# Patient Record
Sex: Male | Born: 1980 | Race: White | Hispanic: No | Marital: Married | Smoking: Former smoker
Health system: Southern US, Community
[De-identification: ages and names within clinical notes are randomized; demographics above are authoritative.]

## PROBLEM LIST (undated history)

## (undated) DIAGNOSIS — T8859XA Other complications of anesthesia, initial encounter: Secondary | ICD-10-CM

## (undated) DIAGNOSIS — J45909 Unspecified asthma, uncomplicated: Secondary | ICD-10-CM

## (undated) DIAGNOSIS — T4145XA Adverse effect of unspecified anesthetic, initial encounter: Secondary | ICD-10-CM

## (undated) DIAGNOSIS — R0789 Other chest pain: Secondary | ICD-10-CM

## (undated) DIAGNOSIS — N189 Chronic kidney disease, unspecified: Secondary | ICD-10-CM

## (undated) DIAGNOSIS — M5416 Radiculopathy, lumbar region: Secondary | ICD-10-CM

## (undated) HISTORY — DX: Radiculopathy, lumbar region: M54.16

## (undated) HISTORY — PX: WISDOM TOOTH EXTRACTION: SHX21

## (undated) HISTORY — PX: WRIST SURGERY: SHX841

---

## 2009-02-13 ENCOUNTER — Ambulatory Visit: Payer: Self-pay | Admitting: Occupational Medicine

## 2009-02-13 LAB — CONVERTED CEMR LAB
Blood in Urine, dipstick: NEGATIVE
Glucose, Urine, Semiquant: NEGATIVE
Nitrite: NEGATIVE
Protein, U semiquant: 30
Specific Gravity, Urine: >=1.03
Urobilinogen, UA: 0.2

## 2009-04-20 ENCOUNTER — Ambulatory Visit: Payer: Self-pay | Admitting: Family Medicine

## 2009-04-20 DIAGNOSIS — K5900 Constipation, unspecified: Secondary | ICD-10-CM | POA: Insufficient documentation

## 2009-04-20 DIAGNOSIS — R1032 Left lower quadrant pain: Secondary | ICD-10-CM | POA: Insufficient documentation

## 2009-04-20 LAB — CONVERTED CEMR LAB
Protein, U semiquant: 30
Specific Gravity, Urine: >=1.03
Urobilinogen, UA: 0.2
pH: 6

## 2010-09-23 DIAGNOSIS — R0789 Other chest pain: Secondary | ICD-10-CM

## 2010-09-23 HISTORY — DX: Other chest pain: R07.89

## 2011-10-07 ENCOUNTER — Emergency Department (INDEPENDENT_AMBULATORY_CARE_PROVIDER_SITE_OTHER)
Admission: EM | Admit: 2011-10-07 | Discharge: 2011-10-07 | Disposition: A | Discharge status: Other Acute Inpatient Hospital | Payer: BC Managed Care – PPO | Source: Home / Self Care

## 2011-10-07 ENCOUNTER — Encounter: Payer: Self-pay | Admitting: *Deleted

## 2011-10-07 DIAGNOSIS — R072 Precordial pain: Secondary | ICD-10-CM

## 2011-10-07 NOTE — ED Provider Notes (Signed)
History     CSN: [id number]  Arrival date & time 10/07/11  1124   None     Chief Complaint  Patient presents with  . Chest Pain      HPI Comments: Patient states that he was cleaning out his boat at about 5:30PM yesterday when he suddenly became very fatigued.  He quit and went into house to take a shower.  He then developed dull pressure-like left chest discomfort that did not radiate.  He denies nausea/vomiting, sweating, or shortness of breath.  The pain was not associated with food intake, although he has had reflux symptoms in the past.  He had difficulty falling asleep, but slept without difficulty.  Upon awakening this morning his pain persisted.  He recalls no injury to his chest, or recent change in physical activities.  He states that he has had costochondritis in the past, but his present pain is different, and he still feels fatigued.  He has no respiratory symptoms.  The pain is not worse with chest movement or inspiration. He has multiple relatives on both sides of family with ASHD, including a paternal uncle with MI age 12. He has never had a lipid panel or cardiac stress test.  Patient is a 31 y.o. male presenting with chest pain. The history is provided by the patient and the spouse.  Chest Pain The chest pain began 12 - 24 hours ago. Chest pain occurs constantly. The chest pain is unchanged. Associated with: nothing. The severity of the pain is mild. The quality of the pain is described as pressure-like. The pain does not radiate. Primary symptoms include fatigue. Pertinent negatives for primary symptoms include no fever, no syncope, no shortness of breath, no cough, no wheezing, no palpitations, no abdominal pain, no nausea, no vomiting and no dizziness.  Associated symptoms include weakness.  Pertinent negatives for associated symptoms include no lower extremity edema and no numbness. He tried nothing for the symptoms. Risk factors include male gender and obesity.  His  family medical history is significant for CAD in family, heart disease in family, hyperlipidemia in family and early MI in family.     History reviewed. No pertinent past medical history.  Past Surgical History  Procedure Date  . Wrist surgery     Family History  Problem Relation Age of Onset  . Heart attack Mother   . Heart attack Father     History  Substance Use Topics  . Smoking status: Former Smoker -- 10 years    Types: Cigarettes    Quit date: 10/06/2010  . Smokeless tobacco: Yes  . Alcohol Use: Yes     occassionally      Review of Systems  Constitutional: Positive for fatigue. Negative for fever.  Respiratory: Negative for cough, shortness of breath and wheezing.   Cardiovascular: Positive for chest pain. Negative for palpitations and syncope.  Gastrointestinal: Negative for nausea, vomiting and abdominal pain.  Neurological: Positive for weakness. Negative for dizziness and numbness.    Allergies  Penicillins  Home Medications  No current outpatient prescriptions on file.  BP 115/80  Pulse 55  Temp(Src) 98.3 F (36.8 C) (Oral)  Resp 14  Ht 6' (1.829 m)  Wt 260 lb (117.935 kg)  BMI 35.26 kg/m2  SpO2 99%  Physical Exam Nursing notes and Vital Signs reviewed. Appearance:  Patient appears stated age, and in no acute distress.  Patient is obese (BMI 35.3)   Eyes:  Pupils are equal, round, and reactive to light  and accomodation.  Extraocular movement is intact.  Conjunctivae are not inflamed  Nose:  No discharge  Pharynx:  Normal Neck:  Supple.   No adenopathy  Lungs:  Clear to auscultation.  Breath sounds are equal.  Chest:  No tenderness over sternum or left pectoralis muscle Heart:  Regular rate and rhythm without murmurs, rubs, or gallops.  Abdomen:  Nontender without masses or hepatosplenomegaly.  Bowel sounds are present.  No CVA or flank tenderness.  Extremities:  No edema.  No calf tenderness Skin:  Warm and dry.  No rash present.   ED  Course  Procedures    EKG:  Sinus bradycardia, rate 58.  No acute changes.    1. Chest pain, precordial       MDM  Suspect angina; pain relieved by NTG SL. Administered O2 by nasal cannula, aspirin PO. Patient transported to [facility] ER for further evaluation        Donna Christen, MD 10/07/11 1358

## 2011-10-07 NOTE — ED Notes (Signed)
One 0.4mg  NTG given @ 1130 AM.  Patient denies pain and is resting comfortably. V/S: 116/78, HR 67 @ 1138AM

## 2011-10-07 NOTE — ED Notes (Signed)
Patient c/o CP x last night. Denies SOB but hesitant to take a deep breath. Pain is on the left side and described as "pressure" 4/10. C/o mild left arm numbness. No diaphoresis, nausea or SOB. Significant family history of MI. EKG done and physician notified.

## 2012-06-02 ENCOUNTER — Encounter: Payer: Self-pay | Admitting: *Deleted

## 2012-06-02 ENCOUNTER — Emergency Department
Admission: EM | Admit: 2012-06-02 | Discharge: 2012-06-02 | Disposition: A | Discharge status: Home / Self Care | Payer: BC Managed Care – PPO | Source: Home / Self Care

## 2012-06-02 DIAGNOSIS — R590 Localized enlarged lymph nodes: Secondary | ICD-10-CM

## 2012-06-02 DIAGNOSIS — R599 Enlarged lymph nodes, unspecified: Secondary | ICD-10-CM

## 2012-06-02 DIAGNOSIS — W57XXXA Bitten or stung by nonvenomous insect and other nonvenomous arthropods, initial encounter: Secondary | ICD-10-CM

## 2012-06-02 MED ORDER — DOXYCYCLINE HYCLATE 100 MG PO CAPS: 100.0000 mg | ORAL_CAPSULE | Freq: Two times a day (BID) | ORAL | Status: AC

## 2012-06-02 NOTE — ED Provider Notes (Signed)
History     CSN: [id number]  Arrival date & time 06/02/12  1205   First MD Initiated Contact with Patient 06/02/12 1214      Chief Complaint  Patient presents with  . Cyst    back of left neck   HPI Pt reports being bit by horsefly on head approx 1 week. Pt states that he noticed some posterior head and neck swelling approx 2-3 days after horsefly bite. Swelling has persisted over the course of the week. No fevers, chills, HA, nuchal rigidity. Some area of posterior hear have been painful. Minimal redness. No purulence or drainage.  History reviewed. No pertinent past medical history.  Past Surgical History  Procedure Date  . Wrist surgery     Family History  Problem Relation Age of Onset  . Heart attack Mother   . Heart disease Mother   . Heart attack Father   . Heart disease Father     History  Substance Use Topics  . Smoking status: Former Smoker -- 10 years    Types: Cigarettes    Quit date: 10/06/2010  . Smokeless tobacco: Not on file  . Alcohol Use: Yes     occassionally      Review of Systems  All other systems reviewed and are negative.    Allergies  Penicillins  Home Medications  No current outpatient prescriptions on file.  BP 122/87  Pulse 60  Temp 98.2 F (36.8 C) (Oral)  Resp 16  Ht 5' 11.5" (1.816 m)  Wt 259 lb (117.482 kg)  BMI 35.62 kg/m2  SpO2 98%  Physical Exam  Constitutional: He is oriented to person, place, and time. He appears well-developed and well-nourished.  HENT:  Head: Normocephalic and atraumatic.  Right Ear: External ear normal.  Left Ear: External ear normal.  Mouth/Throat: Oropharynx is clear and moist.       + post auricular, occipital, and post neck lymphadenopathy.    Eyes: Conjunctivae normal are normal. Pupils are equal, round, and reactive to light.  Neck: Normal range of motion. Neck supple.       + post auricular lymphadenopathy bilaterally    Cardiovascular: Normal rate, regular rhythm and normal  heart sounds.   Pulmonary/Chest: Effort normal and breath sounds normal.  Abdominal: Soft.  Musculoskeletal: Normal range of motion.  Neurological: He is alert and oriented to person, place, and time.  Skin: Skin is warm.    ED Course  Procedures (including critical care time)  Labs Reviewed - No data to display No results found.   1. Lymphadenopathy, occipital   2. Insect bite       MDM  Suspect this is secondary lymphadenopathy from insect bite.  Will place on doxycycline for soft tissue coverage.  Discussed neuro and infectious red flags.  Follow up as needed.         Doree Albee, MD 06/09/12 [phone]

## 2012-06-02 NOTE — ED Notes (Signed)
Patient was bitten by a horse fly 1 week ago, 4 days ago he developed a knot on the back of his head/neck that is tender to touch.

## 2012-06-08 ENCOUNTER — Ambulatory Visit (INDEPENDENT_AMBULATORY_CARE_PROVIDER_SITE_OTHER): Payer: BC Managed Care – PPO | Admitting: Sports Medicine

## 2012-06-08 ENCOUNTER — Encounter: Payer: Self-pay | Admitting: Sports Medicine

## 2012-06-08 VITALS — BP 131/71 | HR 65 | Temp 97.6°F | Resp 18 | Ht 70.25 in | Wt 257.0 lb

## 2012-06-08 DIAGNOSIS — K2970 Gastritis, unspecified, without bleeding: Secondary | ICD-10-CM

## 2012-06-08 DIAGNOSIS — R1032 Left lower quadrant pain: Secondary | ICD-10-CM | POA: Insufficient documentation

## 2012-06-08 DIAGNOSIS — K2990 Gastroduodenitis, unspecified, without bleeding: Secondary | ICD-10-CM

## 2012-06-08 DIAGNOSIS — R221 Localized swelling, mass and lump, neck: Secondary | ICD-10-CM | POA: Insufficient documentation

## 2012-06-08 DIAGNOSIS — R220 Localized swelling, mass and lump, head: Secondary | ICD-10-CM

## 2012-06-08 MED ORDER — PREDNISONE 50 MG PO TABS: ORAL_TABLET | ORAL | Status: DC

## 2012-06-08 MED ORDER — RANITIDINE HCL 300 MG PO TABS: 300.0000 mg | ORAL_TABLET | Freq: Two times a day (BID) | ORAL | Status: DC

## 2012-06-08 MED ORDER — OMEPRAZOLE 40 MG PO CPDR: 40.0000 mg | DELAYED_RELEASE_CAPSULE | Freq: Every day | ORAL | Status: DC

## 2012-06-08 NOTE — Patient Instructions (Signed)
GI cocktail in office. Start the prilosec and Zantac 300mg  2x a day. Do this for a couple days before starting the prednisone. Come back to see me in 2 weeks or so to recheck the mass.

## 2012-06-08 NOTE — Assessment & Plan Note (Signed)
This feels like a simple cyst, well-defined, movable, nontender. We will likely ignore it. I would like him to do some prednisone, however the doxycycline he is on now is giving him some gastritis.  We will control gastritis for a few days with a proton pump inhibitor and ranitidine.

## 2012-06-08 NOTE — Assessment & Plan Note (Addendum)
We'll have him stop the doxycycline. GI cocktail and office, proton pump inhibitor, and ranitidine 3 mg twice a day. He does need some prednisone for the mass in his neck, however I will have him deferred this for a few days until the gastritis resolves. It would certainly be prudent to check some blood work including H. pylori this does not resolve.

## 2012-06-08 NOTE — Progress Notes (Signed)
Subjective:    CC: Establish care. Lump on neck  HPI: Michael Rubio is a very pleasant 31 year old male who comes in with a several week history of a knot that he feels in the left side of his neck. He denies any trauma, denies any upper respiratory type symptoms. The knot is not tender to palpation. He saw another provider placed on doxycycline, this is causing severe gastritis type symptoms. Overall over the past week there has really not been a change in the size of the lesion. Only with deep palpation does become tender. The tenderness does not radiate. He has no family history of neck or head cancer.  Gastritis: Present since taking the doxycycline, no vomiting, but multiple eructations, as well as severe epigastric and substernal burning type pain whenever he tries to eat or drink. He denies any melena, hematochezia, or hematemesis.  Past medical history, Surgical history, Family history, Social history, Allergies, and medications have been entered into the medical record, reviewed, and no changes needed.   Review of Systems: No headache, visual changes, nausea, vomiting, diarrhea, constipation, dizziness, abdominal pain, skin rash, fevers, chills, night sweats, weight loss, chest pain, body aches, joint swelling, muscle aches, or shortness of breath.   Objective:    General: Well Developed, well nourished, and in no acute distress.  Neuro: Alert and oriented x3, extra-ocular muscles intact.  HEENT: Normocephalic, atraumatic, pupils equal round reactive to light, neck supple, no masses, no lymphadenopathy, thyroid nonpalpable. There is a small, 1 cm, mobile, well defined nodule in the deep subcutaneous tissues. This is suggestive of a cyst. Skin: Warm and dry, no rashes noted.  Cardiac: Regular rate and rhythm, no murmurs rubs or gallops.  Respiratory: Clear to auscultation bilaterally. Not using accessory muscles, speaking in full sentences.  Abdominal: Soft, only mildly tender to palpation in  the epigastrium, nondistended, positive bowel sounds, no masses, no organomegaly.  Musculoskeletal: Shoulder, elbow, wrist, hip, knee, ankle stable, and with full range of motion.  Impression and Recommendations:

## 2012-06-24 ENCOUNTER — Encounter: Payer: Self-pay | Admitting: Sports Medicine

## 2012-06-24 ENCOUNTER — Ambulatory Visit (INDEPENDENT_AMBULATORY_CARE_PROVIDER_SITE_OTHER): Payer: BC Managed Care – PPO | Admitting: Sports Medicine

## 2012-06-24 VITALS — BP 130/83 | HR 68 | Wt 260.0 lb

## 2012-06-24 DIAGNOSIS — R221 Localized swelling, mass and lump, neck: Secondary | ICD-10-CM

## 2012-06-24 DIAGNOSIS — K2970 Gastritis, unspecified, without bleeding: Secondary | ICD-10-CM

## 2012-06-24 DIAGNOSIS — E669 Obesity, unspecified: Secondary | ICD-10-CM | POA: Insufficient documentation

## 2012-06-24 DIAGNOSIS — M5416 Radiculopathy, lumbar region: Secondary | ICD-10-CM

## 2012-06-24 DIAGNOSIS — IMO0002 Reserved for concepts with insufficient information to code with codable children: Secondary | ICD-10-CM

## 2012-06-24 HISTORY — DX: Radiculopathy, lumbar region: M54.16

## 2012-06-24 NOTE — Progress Notes (Signed)
Subjective:    CC: Followup gastritis, lump in neck.  HPI: Neck mass: Patient presented with a lump in neck at the last visit , I placed him on some prednisone, and it has since resolved. This was likely an occipital lymph node.  Epigastric pain: Likely was related to doxycycline, he was given a GI cocktail, placed on an H2 blocker, and proton pump inhibitors. Symptoms have since resolved.  Low back pain: This is a new problem for [place], he's had pain on and off for 4-5 years. Localizes the pain in the right lower paraspinal muscles, and notes that it radiates down his buttock on the right side, lateral lower leg, lateral foot on the right side. Worse with bending over, not worse with Valsalva. Has been to a chiropractor for multiple visits, and is overall better. Still has some numbness in the above distribution. Wondering if there's anything else he should be doing.  Past medical history, Surgical history, Family history, Social history, Allergies, and medications have been entered into the medical record, reviewed, and no changes needed.   Review of Systems: No fevers, chills, night sweats, weight loss, chest pain, or shortness of breath.   Objective:    General: Well Developed, well nourished, and in no acute distress.  Neuro: Alert and oriented x3, extra-ocular muscles intact.  HEENT: Normocephalic, atraumatic, pupils equal round reactive to light, neck supple, no masses, no lymphadenopathy, thyroid nonpalpable.  Skin: Warm and dry, no rashes. Cardiac: Regular rate and rhythm, no murmurs rubs or gallops.  Respiratory: Clear to auscultation bilaterally. Not using accessory muscles, speaking in full sentences. Back Exam:  Inspection: Unremarkable  Motion: Flexion 45 deg, Extension 45 deg, Side Bending to 45 deg bilaterally,  Rotation to 45 deg bilaterally  SLR laying: Negative  XSLR laying: Negative  Palpable tenderness: None. FABER: negative. Sensory change: Gross sensation intact to  all lumbar and sacral dermatomes.  Reflexes: 2+ at both patellar tendons, 2+ at achilles tendons, Babinski's downgoing.  Strength at foot  Plantar-flexion: 5/5 Dorsi-flexion: 5/5 Eversion: 5/5 Inversion: 5/5  Leg strength  Quad: 5/5 Hamstring: 5/5 Hip flexor: 5/5 Hip abductors: 5/5  Gait unremarkable.   Impression and Recommendations:

## 2012-06-24 NOTE — Assessment & Plan Note (Signed)
Overall improving with chiropractic treatments. I've given him some further rehabilitation exercises to do on a daily basis at home. Should this recur, he would be a candidate for repeat x-rays, MRI, and likely interventional interlaminar injections. He will bring his x-rays from the chiropractor for me to review.

## 2012-06-24 NOTE — Assessment & Plan Note (Signed)
Resolved

## 2012-06-24 NOTE — Assessment & Plan Note (Addendum)
CBC, CMET, lipid panel, TSH. He will come back to discuss weight loss strategies, and health maintenance.

## 2012-08-19 ENCOUNTER — Encounter: Payer: BC Managed Care – PPO | Admitting: Sports Medicine

## 2012-08-24 ENCOUNTER — Telehealth: Payer: Self-pay

## 2012-08-24 ENCOUNTER — Encounter: Payer: Self-pay | Admitting: Sports Medicine

## 2012-08-24 ENCOUNTER — Ambulatory Visit (INDEPENDENT_AMBULATORY_CARE_PROVIDER_SITE_OTHER): Payer: BC Managed Care – PPO

## 2012-08-24 ENCOUNTER — Ambulatory Visit (INDEPENDENT_AMBULATORY_CARE_PROVIDER_SITE_OTHER): Payer: BC Managed Care – PPO | Admitting: Sports Medicine

## 2012-08-24 VITALS — BP 118/73 | HR 72 | Temp 98.1°F | Resp 18 | Wt 259.0 lb

## 2012-08-24 DIAGNOSIS — J0100 Acute maxillary sinusitis, unspecified: Secondary | ICD-10-CM

## 2012-08-24 DIAGNOSIS — R071 Chest pain on breathing: Secondary | ICD-10-CM

## 2012-08-24 DIAGNOSIS — R091 Pleurisy: Secondary | ICD-10-CM

## 2012-08-24 DIAGNOSIS — R0989 Other specified symptoms and signs involving the circulatory and respiratory systems: Secondary | ICD-10-CM

## 2012-08-24 MED ORDER — FLUTICASONE PROPIONATE 50 MCG/ACT NA SUSP: NASAL | Status: DC

## 2012-08-24 MED ORDER — AZITHROMYCIN 250 MG PO TABS: ORAL_TABLET | ORAL | Status: DC

## 2012-08-24 NOTE — Assessment & Plan Note (Signed)
Few crackles also heard at the left base. Chest x-ray. Azithromycin for sinusitis should cover community-acquired pneumonia if present.

## 2012-08-24 NOTE — Assessment & Plan Note (Signed)
With double sickening. Azithromycin and Flonase. Return to clinic as needed.

## 2012-08-24 NOTE — Telephone Encounter (Signed)
Michael Rubio called and would like the results to his chest xray.

## 2012-08-24 NOTE — Progress Notes (Signed)
Subjective:     Patient ID: Michael Rubio, male   DOB: 1981/03/16, 31 y.o.   MRN: [id number]  HPI Patient is a 31 yo man who presents with a 5 day history of cough and runny nose and after developing a left sided knife like pain on inspiration this morning.   Patient states that he woke up and it hurt to take deep breaths in. He could not recreate the pain on inspiration and it does not bother him when he coughs or sneezes.   The patients history of URI symptoms date back 5 days, he was improving until yesterday at which time it started to progress again in particular his cough was worse.   He has not taken any medications and has not found anything that makes his symptoms better. The pain is worsened on inspiration.   Past medical history, Surgical history, Family history, Social history, Allergies, and medications have been entered into the medical record, reviewed, and no changes needed.   Review of Systems  Constitutional: Negative for fever, chills, diaphoresis and fatigue.  HENT: Positive for congestion, rhinorrhea, postnasal drip, sinus pressure and ear discharge. Negative for ear pain.   Respiratory: Positive for cough.   Cardiovascular: Negative for chest pain.  Gastrointestinal: Negative.   All other systems reviewed and are negative.      Objective:   Physical Exam  Constitutional: He is oriented to person, place, and time. He appears well-developed.  HENT:  Head: Normocephalic and atraumatic.  Mouth/Throat: No oropharyngeal exudate.       Erythema on Right tympanic membrane and oropharynx. Boggy erythematous nasal turbinates  Eyes: Conjunctivae normal are normal. Pupils are equal, round, and reactive to light.  Neck: Normal range of motion. Neck supple.  Cardiovascular: Normal rate, regular rhythm and normal heart sounds.  Exam reveals no gallop and no friction rub.   No murmur heard. Pulmonary/Chest: Effort normal. No respiratory distress. He has no wheezes. He has  no rales. He exhibits no tenderness.       Crackles at base of left lung  Abdominal: Soft. Bowel sounds are normal. He exhibits no distension and no mass. There is no tenderness. There is no rebound and no guarding.  Musculoskeletal: Normal range of motion.  Neurological: He is alert and oriented to person, place, and time.  Skin: Skin is dry.  Psychiatric: He has a normal mood and affect. His behavior is normal. Thought content normal.      Assessment/plan:

## 2012-08-24 NOTE — Telephone Encounter (Signed)
Negative, this is good news.

## 2012-08-24 NOTE — Telephone Encounter (Signed)
Patient advised.

## 2012-08-27 ENCOUNTER — Encounter: Payer: BC Managed Care – PPO | Admitting: Sports Medicine

## 2012-09-25 ENCOUNTER — Encounter: Payer: BC Managed Care – PPO | Admitting: Sports Medicine

## 2012-10-07 ENCOUNTER — Encounter: Payer: Self-pay | Admitting: Family Medicine

## 2012-10-07 ENCOUNTER — Ambulatory Visit (INDEPENDENT_AMBULATORY_CARE_PROVIDER_SITE_OTHER): Payer: BC Managed Care – PPO | Admitting: Family Medicine

## 2012-10-07 VITALS — BP 121/77 | HR 98 | Temp 98.2°F | Wt 254.0 lb

## 2012-10-07 DIAGNOSIS — J111 Influenza due to unidentified influenza virus with other respiratory manifestations: Secondary | ICD-10-CM

## 2012-10-07 DIAGNOSIS — R509 Fever, unspecified: Secondary | ICD-10-CM

## 2012-10-07 LAB — POCT INFLUENZA A/B: Influenza A, POC: POSITIVE

## 2012-10-07 MED ORDER — OSELTAMIVIR PHOSPHATE 75 MG PO CAPS: 75.0000 mg | ORAL_CAPSULE | Freq: Two times a day (BID) | ORAL | Status: DC

## 2012-10-07 MED ORDER — HYDROCODONE-HOMATROPINE 5-1.5 MG/5ML PO SYRP: 5.0000 mL | ORAL_SOLUTION | Freq: Four times a day (QID) | ORAL | Status: DC | PRN

## 2012-10-07 NOTE — Progress Notes (Signed)
CC: Michael Rubio is a 32 y.o. male is here for Fever   Subjective: HPI:  Patient reports 24 hours of body aches, nonproductive cough, very mild shortness of breath with exertion, fever of 101.0 last night, and fatigue. Symptoms are described as mild to moderate and fluctuates throughout the day not particularly influenced anything in particular. Body aches are diffuse. Tylenol product has helped with aches and fever, nothing else has seemed to make constellation of symptoms better or worse. Denies chills, confusion, wheezing, chest pain, back pain, abdominal pain, GI disturbance, rashes, focal joint pain. No sick contacts     Review Of Systems Outlined In HPI  History reviewed. No pertinent past medical history.   Family History  Problem Relation Age of Onset  . Heart attack Mother   . Heart disease Mother   . Heart attack Father   . Heart disease Father   . Heart disease Maternal Grandmother   . Diabetes Maternal Grandmother   . Heart disease Maternal Grandfather   . COPD Paternal Grandfather   . Heart disease Paternal Grandfather      History  Substance Use Topics  . Smoking status: Former Smoker -- 1.0 packs/day for 10 years    Types: Cigarettes    Quit date: 10/06/2010  . Smokeless tobacco: Current User    Types: Snuff, Chew  . Alcohol Use: 1.2 oz/week    2 Cans of beer per week     Comment: occassionally     Objective: Filed Vitals:   10/07/12 1038  BP: 121/77  Pulse: 98  Temp: 98.2 F (36.8 C)    General: Alert and Oriented, No Acute Distress, appears mildly fatigue HEENT: Pupils equal, round, reactive to light. Conjunctivae clear.  External ears unremarkable, canals clear with intact TMs with appropriate landmarks.  Middle ear appears open without effusion. Pink inferior turbinates.  Moist mucous membranes, pharynx without inflammation nor lesions.  Neck supple without palpable lymphadenopathy nor abnormal masses. Lungs: Clear to auscultation bilaterally,  no wheezing/ronchi/rales.  Comfortable work of breathing. Good air movement., Coughing infertility during exam Cardiac: Regular rate and rhythm. Normal S1/S2.  No murmurs, rubs, nor gallops.   Extremities: No peripheral edema.  Strong peripheral pulses.  Mental Status: No depression, anxiety, nor agitation. Skin: Warm and dry.  Assessment & Plan: Kavir was seen today for fever.  Diagnoses and associated orders for this visit:  Fever - POCT Influenza A/B  Influenza - oseltamivir (TAMIFLU) 75 MG capsule; Take 1 capsule (75 mg total) by mouth 2 (two) times daily. - HYDROcodone-homatropine (HYCODAN) 5-1.5 MG/5ML syrup; Take 5 mLs by mouth every 6 (six) hours as needed for cough.    Rapid flu positive, start Tamiflu, Hycodan given to help with cough. Stay well hydrated, consider Alka-Seltzer products for symptoms treatment. Focus on rest for the next 48 hours. Infectious precautions discussed  Return if symptoms worsen or fail to improve.

## 2013-01-20 ENCOUNTER — Encounter: Payer: Self-pay | Admitting: *Deleted

## 2013-01-20 ENCOUNTER — Encounter: Payer: Self-pay | Admitting: Sports Medicine

## 2013-01-20 ENCOUNTER — Ambulatory Visit (INDEPENDENT_AMBULATORY_CARE_PROVIDER_SITE_OTHER): Payer: BC Managed Care – PPO | Admitting: Sports Medicine

## 2013-01-20 VITALS — BP 119/79 | HR 54 | Wt 255.0 lb

## 2013-01-20 DIAGNOSIS — Z299 Encounter for prophylactic measures, unspecified: Secondary | ICD-10-CM

## 2013-01-20 DIAGNOSIS — Z0000 Encounter for general adult medical examination without abnormal findings: Secondary | ICD-10-CM | POA: Insufficient documentation

## 2013-01-20 DIAGNOSIS — K2990 Gastroduodenitis, unspecified, without bleeding: Secondary | ICD-10-CM

## 2013-01-20 DIAGNOSIS — K2970 Gastritis, unspecified, without bleeding: Secondary | ICD-10-CM

## 2013-01-20 LAB — CBC
HCT: 44.1 % (ref 39.0–52.0)
Hemoglobin: 15.1 g/dL (ref 13.0–17.0)
MCH: 28.7 pg (ref 26.0–34.0)
MCHC: 34.2 g/dL (ref 30.0–36.0)
MCV: 83.8 fL (ref 78.0–100.0)
Platelets: 225 K/uL (ref 150–400)
RBC: 5.26 MIL/uL (ref 4.22–5.81)
RDW: 14.1 % (ref 11.5–15.5)
WBC: 6.3 10*3/uL (ref 4.0–10.5)

## 2013-01-20 LAB — POC HEMOCCULT BLD/STL (OFFICE/1-CARD/DIAGNOSTIC): Fecal Occult Blood, POC: POSITIVE

## 2013-01-20 MED ORDER — SUCRALFATE 1 G PO TABS: 1.0000 g | ORAL_TABLET | Freq: Four times a day (QID) | ORAL | Status: DC

## 2013-01-20 MED ORDER — RANITIDINE HCL 300 MG PO TABS: 300.0000 mg | ORAL_TABLET | Freq: Two times a day (BID) | ORAL | Status: DC

## 2013-01-20 NOTE — Progress Notes (Signed)
  Subjective:    CC: Abdominal pain  HPI: This very pleasant 32 year old male comes in with a several-day history of pain he localizes in the epigastrium without radiation, worse with food, better with Pepto-Bismol. He does endorse a few days of dark stools, but no overtly bloody or melena. Pain is described as a deep, boring-type pain. He denies any fevers, chills, upper respiratory symptoms, no nausea, vomiting or diarrhea. He had a similar symptom months ago, which resolved with Prilosec. No sick contacts.  Past medical history, Surgical history, Family history not pertinant except as noted below, Social history, Allergies, and medications have been entered into the medical record, reviewed, and no changes needed.   Review of Systems: No fevers, chills, night sweats, weight loss, chest pain, or shortness of breath.   Objective:    General: Well Developed, well nourished, and in no acute distress.  Neuro: Alert and oriented x3, extra-ocular muscles intact, sensation grossly intact.  HEENT: Normocephalic, atraumatic, pupils equal round reactive to light, neck supple, no masses, no lymphadenopathy, thyroid nonpalpable.  Skin: Warm and dry, no rashes. Cardiac: Regular rate and rhythm, no murmurs rubs or gallops, no lower extremity edema.  Respiratory: Clear to auscultation bilaterally. Not using accessory muscles, speaking in full sentences. Abdomen: Soft, tender to palpation in the left lower quadrant and the epigastrium, no guarding, no rebound tenderness, no palpable masses, normal bowel sounds. Rectal: Good tone, Hemoccult positive.  Impression and Recommendations:

## 2013-01-20 NOTE — Assessment & Plan Note (Signed)
He is fasting today, checking lipids, testosterone, TSH, vitamin D.

## 2013-01-20 NOTE — Assessment & Plan Note (Signed)
With positive fecal occult blood. CBC, CMET, lipase, H. pylori. Restart Prilosec and Zantac twice a day. Carafate. GI referral.

## 2013-01-21 LAB — LIPID PANEL
Cholesterol: 168 mg/dL (ref 0–200)
HDL: 36 mg/dL — ABNORMAL LOW (ref >39)
LDL Cholesterol: 109 mg/dL — ABNORMAL HIGH (ref 0–99)
Total CHOL/HDL Ratio: 4.7 ratio
Triglycerides: 116 mg/dL (ref <150)
VLDL: 23 mg/dL (ref 0–40)

## 2013-01-21 LAB — H. PYLORI ANTIBODY, IGG: H Pylori IgG: <0.4 {ISR}

## 2013-01-21 LAB — TESTOSTERONE, FREE, TOTAL, SHBG
Sex Hormone Binding: 34 nmol/L (ref 13–71)
Testosterone, Free: 89 pg/mL (ref 47.0–244.0)
Testosterone-% Free: 2 % (ref 1.6–2.9)
Testosterone: 440 ng/dL (ref 300–890)

## 2013-01-21 LAB — COMPREHENSIVE METABOLIC PANEL WITH GFR
ALT: 29 U/L (ref 0–53)
AST: 20 U/L (ref 0–37)
BUN: 11 mg/dL (ref 6–23)
CO2: 26 meq/L (ref 19–32)
Creat: 0.99 mg/dL (ref 0.50–1.35)
Total Bilirubin: 0.5 mg/dL (ref 0.3–1.2)

## 2013-01-21 LAB — COMPREHENSIVE METABOLIC PANEL
Albumin: 4.1 g/dL (ref 3.5–5.2)
Alkaline Phosphatase: 55 U/L (ref 39–117)
Calcium: 9.3 mg/dL (ref 8.4–10.5)
Chloride: 105 mEq/L (ref 96–112)
Glucose, Bld: 94 mg/dL (ref 70–99)
Potassium: 4.2 mEq/L (ref 3.5–5.3)
Sodium: 141 mEq/L (ref 135–145)
Total Protein: 6.6 g/dL (ref 6.0–8.3)

## 2013-01-21 LAB — LIPASE: Lipase: 19 U/L (ref 0–75)

## 2013-01-21 LAB — TSH: TSH: 0.921 u[IU]/mL (ref 0.350–4.500)

## 2013-01-21 LAB — VITAMIN D 25 HYDROXY (VIT D DEFICIENCY, FRACTURES): Vit D, 25-Hydroxy: 35 ng/mL (ref 30–89)

## 2013-02-11 ENCOUNTER — Encounter: Payer: Self-pay | Admitting: Sports Medicine

## 2013-02-12 ENCOUNTER — Encounter: Payer: Self-pay | Admitting: Sports Medicine

## 2013-02-17 ENCOUNTER — Ambulatory Visit: Payer: BC Managed Care – PPO | Admitting: Sports Medicine

## 2013-03-05 ENCOUNTER — Ambulatory Visit: Payer: BC Managed Care – PPO | Admitting: Sports Medicine

## 2013-03-22 ENCOUNTER — Encounter: Payer: Self-pay | Admitting: Family Medicine

## 2013-03-22 ENCOUNTER — Ambulatory Visit (INDEPENDENT_AMBULATORY_CARE_PROVIDER_SITE_OTHER): Payer: BC Managed Care – PPO | Admitting: Family Medicine

## 2013-03-22 VITALS — BP 110/70 | HR 55 | Temp 98.1°F | Wt 254.0 lb

## 2013-03-22 DIAGNOSIS — H53131 Sudden visual loss, right eye: Secondary | ICD-10-CM

## 2013-03-22 DIAGNOSIS — R631 Polydipsia: Secondary | ICD-10-CM

## 2013-03-22 DIAGNOSIS — H53139 Sudden visual loss, unspecified eye: Secondary | ICD-10-CM

## 2013-03-22 LAB — BASIC METABOLIC PANEL WITH GFR
BUN: 14 mg/dL (ref 6–23)
Creat: 1.1 mg/dL (ref 0.50–1.35)
GFR, Est African American: >89 mL/min
Glucose, Bld: 73 mg/dL (ref 70–99)
Potassium: 4.5 mEq/L (ref 3.5–5.3)

## 2013-03-22 NOTE — Progress Notes (Signed)
CC: Michael Rubio is a 32 y.o. male is here for Cloudy Vision   Subjective: HPI:  Patient presents with concerns of vision loss in the right eye that began one week ago was noticed upon awakening. He describes this as preserved vision in central visual field blurring in the periphery in a 360 pattern which is homogenous. He can see lights and shape in his periphery but has trouble focusing in this region. He predicts 90% vision loss when this first occurred it has improved to 50% without improvement since 5 days ago. Nothing makes it better or worse. It is present all hours of the day. He denies any pain in or near the eye whatsoever. He denies any other motor or sensory disturbances. He denies discharge from the eye nor recent injury nor working without proper eye protection. He has noticed a days before this he is been more thirsty than normal for him. He denies fevers, chills, headache, memory loss, confusion, facial pain, rashes, polyuria, poorly healing wounds, chest pain, limb claudication. He denies curtain like vision loss, floaters, flashing lights, nor any other visual disturbance other than the above   Review Of Systems Outlined In HPI  Past Medical History  Diagnosis Date  . Lumbar radiculopathy, chronic right S1 06/24/2012     Family History  Problem Relation Age of Onset  . Heart attack Mother   . Heart disease Mother   . Heart attack Father   . Heart disease Father   . Heart disease Maternal Grandmother   . Diabetes Maternal Grandmother   . Heart disease Maternal Grandfather   . COPD Paternal Grandfather   . Heart disease Paternal Grandfather      History  Substance Use Topics  . Smoking status: Former Smoker -- 1.00 packs/day for 10 years    Types: Cigarettes    Quit date: 10/06/2010  . Smokeless tobacco: Current User    Types: Snuff, Chew  . Alcohol Use: 1.2 oz/week    2 Cans of beer per week     Comment: occassionally     Objective: Filed Vitals:   03/22/13 1457  BP: 110/70  Pulse: 55  Temp: 98.1 F (36.7 C)    General: Alert and Oriented, No Acute Distress HEENT: Pupils equal, round, reactive to light.  anterior chamber without debris. Fluoresceins staining with ultraviolet lamp shows no abnormality on the external cornea .Conjunctivae clear.  External ears unremarkable, canals clear with intact TMs with appropriate landmarks.  Middle ear appears open without effusion. Pink inferior turbinates.  Moist mucous membranes, pharynx without inflammation nor lesions.  Neck supple without palpable lymphadenopathy nor abnormal masses. Peripheral vision is grossly intact with respect to able to see pen light 90 laterally 60 medially Lungs:Clear and comfortable work of breathing  Cardiac: Regular rate and rhythm. Normal S1/S2.  No murmurs, rubs, nor gallops.   Extremities: No peripheral edema.  Strong peripheral pulses.  Mental Status: No depression, anxiety, nor agitation. Skin: Warm and dry.  Assessment & Plan: Dianne was seen today for cloudy vision.  Diagnoses and associated orders for this visit:  Vision, loss, sudden, right - BASIC METABOLIC PANEL WITH GFR - Ambulatory referral to Ophthalmology  Polydipsia - BASIC METABOLIC PANEL WITH GFR    Patient is quite concerned about diabetes I discussed that this could be an issue however I would expect more bilateral involvement, will obtain basic metabolic panel today. Discussed that I believe he needs a urgent dilated funduscopic exam along with slit-lamp exam, we arranged  ophthalmology visit this afternoon.  Return if symptoms worsen or fail to improve.

## 2013-05-10 ENCOUNTER — Telehealth: Payer: Self-pay | Admitting: *Deleted

## 2013-05-10 NOTE — Telephone Encounter (Signed)
Ok they are faxing, will be here after lunch

## 2013-05-10 NOTE — Telephone Encounter (Signed)
Patient called stated that he went to the eye doctor about 3 weeks ago for his vision he stated noted that he had fluid under his pupil, he stated that the pain is still there he wants to know is this something that he should be worried about.

## 2013-05-10 NOTE — Telephone Encounter (Signed)
Sue Lush, Will you please request records from [facility] at [phone] so I can go over their findings, Dr. Karie Schwalbe may have already reviewed them but he's not here this week for me to ask.  Will you also please let Vaun know that I'll have feedback for him once I get these results.

## 2013-05-11 NOTE — Telephone Encounter (Signed)
Central serus retinopathy. If it doesn't get any better then they would refer him to a another specialist. A lot of times it resolves on its on.

## 2013-05-11 NOTE — Telephone Encounter (Signed)
Left message on pt's cell phone to call Dr [facility] and f/u with them

## 2013-05-11 NOTE — Telephone Encounter (Signed)
Sue Lush, Thank you for clarifying, from what I gather it would be wise for Casimiro Needle to f/u with the optho office since I've never been trained with management of central serous retinopathy.

## 2013-05-11 NOTE — Telephone Encounter (Signed)
Waldron Session and I are having trouble reading the last 4 lines of the progress note sent to [place], would any of the [facility] be able to help [place] with the handwriting so I can help Mr. Guice with feedback.

## 2013-06-28 ENCOUNTER — Ambulatory Visit (INDEPENDENT_AMBULATORY_CARE_PROVIDER_SITE_OTHER): Payer: BC Managed Care – PPO | Admitting: Physician Assistant

## 2013-06-28 ENCOUNTER — Encounter: Payer: Self-pay | Admitting: Physician Assistant

## 2013-06-28 VITALS — BP 120/74 | HR 66 | Temp 97.8°F | Wt 250.0 lb

## 2013-06-28 DIAGNOSIS — J069 Acute upper respiratory infection, unspecified: Secondary | ICD-10-CM

## 2013-06-28 DIAGNOSIS — J209 Acute bronchitis, unspecified: Secondary | ICD-10-CM

## 2013-06-28 MED ORDER — AZITHROMYCIN 250 MG PO TABS: ORAL_TABLET | ORAL | Status: DC

## 2013-06-28 MED ORDER — HYDROCODONE-HOMATROPINE 5-1.5 MG/5ML PO SYRP: 5.0000 mL | ORAL_SOLUTION | Freq: Every evening | ORAL | Status: DC | PRN

## 2013-06-28 NOTE — Patient Instructions (Addendum)
Vitamin C and Zinc "Mucinex D"  Bronchitis Bronchitis is the body's way of reacting to injury and/or infection (inflammation) of the bronchi. Bronchi are the air tubes that extend from the windpipe into the lungs. If the inflammation becomes severe, it may cause shortness of breath. CAUSES  Inflammation may be caused by:  A virus.  Germs (bacteria).  Dust.  Allergens.  Pollutants and many other irritants. The cells lining the bronchial tree are covered with tiny hairs (cilia). These constantly beat upward, away from the lungs, toward the mouth. This keeps the lungs free of pollutants. When these cells become too irritated and are unable to do their job, mucus begins to develop. This causes the characteristic cough of bronchitis. The cough clears the lungs when the cilia are unable to do their job. Without either of these protective mechanisms, the mucus would settle in the lungs. Then you would develop pneumonia. Smoking is a common cause of bronchitis and can contribute to pneumonia. Stopping this habit is the single most important thing you can do to help yourself. TREATMENT   Your caregiver may prescribe an antibiotic if the cough is caused by bacteria. Also, medicines that open up your airways make it easier to breathe. Your caregiver may also recommend or prescribe an expectorant. It will loosen the mucus to be coughed up. Only take over-the-counter or prescription medicines for pain, discomfort, or fever as directed by your caregiver.  Removing whatever causes the problem (smoking, for example) is critical to preventing the problem from getting worse.  Cough suppressants may be prescribed for relief of cough symptoms.  Inhaled medicines may be prescribed to help with symptoms now and to help prevent problems from returning.  For those with recurrent (chronic) bronchitis, there may be a need for steroid medicines. SEEK IMMEDIATE MEDICAL CARE IF:   During treatment, you develop  more pus-like mucus (purulent sputum).  You have a fever.  Your baby is older than 3 months with a rectal temperature of 102 F (38.9 C) or higher.  Your baby is 52 months old or younger with a rectal temperature of 100.4 F (38 C) or higher.  You become progressively more ill.  You have increased difficulty breathing, wheezing, or shortness of breath. It is necessary to seek immediate medical care if you are elderly or sick from any other disease. MAKE SURE YOU:   Understand these instructions.  Will watch your condition.  Will get help right away if you are not doing well or get worse. Document Released: 09/09/2005 Document Revised: 12/02/2011 Document Reviewed: 07/19/2008 [facility] Patient Information 2014 [place], [place].

## 2013-06-28 NOTE — Progress Notes (Signed)
  Subjective:    Patient ID: Michael Rubio, male    DOB: 12-07-1980, 32 y.o.   MRN: [id number]  HPI Patient is a 32 yo male who presents to the clinic with sinus drainage, cough, headache for going on 10 days. Former smoker but not smoked in 2 years. Denies any fever, chills, nausea, vomiting, diarrhea, wheezing or SOB. Chest fills very tight. Neck lymph nodes are very tender to touch. No appetite. Can't get any rest with cough all the time. Tried cough syrup and tylenol sinus doesn't seem to be helping. Whole family is sick.     Review of Systems     Objective:   Physical Exam  Constitutional: He is oriented to person, place, and time. He appears well-developed and well-nourished.  HENT:  Head: Normocephalic and atraumatic.  Right Ear: External ear normal.  Left Ear: External ear normal.  TM's clear bilaterally. Oropharynx erythematous with post nasal drip.   Eyes: Conjunctivae are normal. Right eye exhibits no discharge. Left eye exhibits no discharge.  Neck: Normal range of motion. Neck supple.  Tender adenopathy on left anterior cervical.   Cardiovascular: Normal rate, regular rhythm and normal heart sounds.   Pulmonary/Chest: Effort normal and breath sounds normal. He has no wheezes.  Lymphadenopathy:    He has cervical adenopathy.  Neurological: He is alert and oriented to person, place, and time.  Skin: Skin is warm and dry.  Psychiatric: He has a normal mood and affect. His behavior is normal.          Assessment & Plan:  Bronchitis/ST- treated with zpak due to PCN allergy. Gave cough syrup to use at night. Recommended honey or delsym for cough during the day. Pt aware I do think started off as virus but moved to more infectious. Discussed hand washing, vitamin C, and zinc.I do think ST caused by PND and recommended Mucinex D. Follow up as needed.

## 2014-05-01 ENCOUNTER — Encounter: Payer: Self-pay | Admitting: Emergency Medicine

## 2014-05-01 ENCOUNTER — Emergency Department
Admission: EM | Admit: 2014-05-01 | Discharge: 2014-05-01 | Disposition: A | Discharge status: Home / Self Care | Payer: BC Managed Care – PPO | Source: Home / Self Care | Attending: Emergency Medicine | Admitting: Emergency Medicine

## 2014-05-01 DIAGNOSIS — S0180XA Unspecified open wound of other part of head, initial encounter: Secondary | ICD-10-CM

## 2014-05-01 DIAGNOSIS — S0083XA Contusion of other part of head, initial encounter: Secondary | ICD-10-CM

## 2014-05-01 DIAGNOSIS — Z23 Encounter for immunization: Secondary | ICD-10-CM

## 2014-05-01 DIAGNOSIS — S0181XA Laceration without foreign body of other part of head, initial encounter: Secondary | ICD-10-CM

## 2014-05-01 DIAGNOSIS — S0003XA Contusion of scalp, initial encounter: Secondary | ICD-10-CM

## 2014-05-01 DIAGNOSIS — S1093XA Contusion of unspecified part of neck, initial encounter: Secondary | ICD-10-CM

## 2014-05-01 DIAGNOSIS — S0093XA Contusion of unspecified part of head, initial encounter: Secondary | ICD-10-CM

## 2014-05-01 MED ORDER — HYDROCODONE-ACETAMINOPHEN 5-325 MG PO TABS: 1.0000 | ORAL_TABLET | ORAL | Status: DC | PRN

## 2014-05-01 MED ORDER — DOXYCYCLINE HYCLATE 100 MG PO CAPS: 100.0000 mg | ORAL_CAPSULE | Freq: Two times a day (BID) | ORAL | Status: DC

## 2014-05-01 MED ORDER — TETANUS-DIPHTH-ACELL PERTUSSIS 5-2.5-18.5 LF-MCG/0.5 IM SUSP
0.5000 mL | Freq: Once | INTRAMUSCULAR | Status: AC
  Administered 2014-05-01: 0.5 mL via INTRAMUSCULAR
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
## 2014-05-03 ENCOUNTER — Ambulatory Visit (INDEPENDENT_AMBULATORY_CARE_PROVIDER_SITE_OTHER): Payer: BC Managed Care – PPO | Admitting: Sports Medicine

## 2014-05-03 ENCOUNTER — Encounter: Payer: Self-pay | Admitting: Sports Medicine

## 2014-05-03 VITALS — BP 136/87 | HR 76 | Ht 71.0 in | Wt 266.0 lb

## 2014-05-03 DIAGNOSIS — S0180XA Unspecified open wound of other part of head, initial encounter: Secondary | ICD-10-CM | POA: Diagnosis not present

## 2014-05-03 DIAGNOSIS — S060X0A Concussion without loss of consciousness, initial encounter: Secondary | ICD-10-CM

## 2014-05-03 DIAGNOSIS — S0181XA Laceration without foreign body of other part of head, initial encounter: Secondary | ICD-10-CM | POA: Insufficient documentation

## 2014-05-03 NOTE — Patient Instructions (Signed)
Concussion  A concussion, or closed-head injury, is a brain injury caused by a direct blow to the head or by a quick and sudden movement (jolt) of the head or neck. Concussions are usually not life-threatening. Even so, the effects of a concussion can be serious. If you have had a concussion before, you are more likely to experience concussion-like symptoms after a direct blow to the head.   CAUSES  · Direct blow to the head, such as from running into another player during a soccer game, being hit in a fight, or hitting your head on a hard surface.  · A jolt of the head or neck that causes the brain to move back and forth inside the skull, such as in a car crash.  SIGNS AND SYMPTOMS  The signs of a concussion can be hard to notice. Early on, they may be missed by you, family members, and health care providers. You may look fine but act or feel differently.  Symptoms are usually temporary, but they may last for days, weeks, or even longer. Some symptoms may appear right away while others may not show up for hours or days. Every head injury is different. Symptoms include:  · Mild to moderate headaches that will not go away.  · A feeling of pressure inside your head.  · Having more trouble than usual:  ¨ Learning or remembering things you have heard.  ¨ Answering questions.  ¨ Paying attention or concentrating.  ¨ Organizing daily tasks.  ¨ Making decisions and solving problems.  · Slowness in thinking, acting or reacting, speaking, or reading.  · Getting lost or being easily confused.  · Feeling tired all the time or lacking energy (fatigued).  · Feeling drowsy.  · Sleep disturbances.  ¨ Sleeping more than usual.  ¨ Sleeping less than usual.  ¨ Trouble falling asleep.  ¨ Trouble sleeping (insomnia).  · Loss of balance or feeling lightheaded or dizzy.  · Nausea or vomiting.  · Numbness or tingling.  · Increased sensitivity to:  ¨ Sounds.  ¨ Lights.  ¨ Distractions.  · Vision problems or eyes that tire  easily.  · Diminished sense of taste or smell.  · Ringing in the ears.  · Mood changes such as feeling sad or anxious.  · Becoming easily irritated or angry for little or no reason.  · Lack of motivation.  · Seeing or hearing things other people do not see or hear (hallucinations).  DIAGNOSIS  Your health care provider can usually diagnose a concussion based on a description of your injury and symptoms. He or she will ask whether you passed out (lost consciousness) and whether you are having trouble remembering events that happened right before and during your injury.  Your evaluation might include:  · A brain scan to look for signs of injury to the brain. Even if the test shows no injury, you may still have a concussion.  · Blood tests to be sure other problems are not present.  TREATMENT  · Concussions are usually treated in an emergency department, in urgent care, or at a clinic. You may need to stay in the hospital overnight for further treatment.  · Tell your health care provider if you are taking any medicines, including prescription medicines, over-the-counter medicines, and natural remedies. Some medicines, such as blood thinners (anticoagulants) and aspirin, may increase the chance of complications. Also tell your health care provider whether you have had alcohol or are taking illegal drugs. This information   may affect treatment.  · Your health care provider will send you home with important instructions to follow.  · How fast you will recover from a concussion depends on many factors. These factors include how severe your concussion is, what part of your brain was injured, your age, and how healthy you were before the concussion.  · Most people with mild injuries recover fully. Recovery can take time. In general, recovery is slower in older persons. Also, persons who have had a concussion in the past or have other medical problems may find that it takes longer to recover from their current injury.  HOME  CARE INSTRUCTIONS  General Instructions  · Carefully follow the directions your health care provider gave you.  · Only take over-the-counter or prescription medicines for pain, discomfort, or fever as directed by your health care provider.  · Take only those medicines that your health care provider has approved.  · Do not drink alcohol until your health care provider says you are well enough to do so. Alcohol and certain other drugs may slow your recovery and can put you at risk of further injury.  · If it is harder than usual to remember things, write them down.  · If you are easily distracted, try to do one thing at a time. For example, do not try to watch TV while fixing dinner.  · Talk with family members or close friends when making important decisions.  · Keep all follow-up appointments. Repeated evaluation of your symptoms is recommended for your recovery.  · Watch your symptoms and tell others to do the same. Complications sometimes occur after a concussion. Older adults with a brain injury may have a higher risk of serious complications, such as a blood clot on the brain.  · Tell your teachers, school nurse, school counselor, coach, athletic trainer, or work manager about your injury, symptoms, and restrictions. Tell them about what you can or cannot do. They should watch for:  ¨ Increased problems with attention or concentration.  ¨ Increased difficulty remembering or learning new information.  ¨ Increased time needed to complete tasks or assignments.  ¨ Increased irritability or decreased ability to cope with stress.  ¨ Increased symptoms.  · Rest. Rest helps the brain to heal. Make sure you:  ¨ Get plenty of sleep at night. Avoid staying up late at night.  ¨ Keep the same bedtime hours on weekends and weekdays.  ¨ Rest during the day. Take daytime naps or rest breaks when you feel tired.  · Limit activities that require a lot of thought or concentration. These include:  ¨ Doing homework or job-related  work.  ¨ Watching TV.  ¨ Working on the computer.  · Avoid any situation where there is potential for another head injury (football, hockey, soccer, basketball, martial arts, downhill snow sports and horseback riding). Your condition will get worse every time you experience a concussion. You should avoid these activities until you are evaluated by the appropriate follow-up health care providers.  Returning To Your Regular Activities  You will need to return to your normal activities slowly, not all at once. You must give your body and brain enough time for recovery.  · Do not return to sports or other athletic activities until your health care provider tells you it is safe to do so.  · Ask your health care provider when you can drive, ride a bicycle, or operate heavy machinery. Your ability to react may be slower after a   brain injury. Never do these activities if you are dizzy.  · Ask your health care provider about when you can return to work or school.  Preventing Another Concussion  It is very important to avoid another brain injury, especially before you have recovered. In rare cases, another injury can lead to permanent brain damage, brain swelling, or death. The risk of this is greatest during the first 7-10 days after a head injury. Avoid injuries by:  · Wearing a seat belt when riding in a car.  · Drinking alcohol only in moderation.  · Wearing a helmet when biking, skiing, skateboarding, skating, or doing similar activities.  · Avoiding activities that could lead to a second concussion, such as contact or recreational sports, until your health care provider says it is okay.  · Taking safety measures in your home.  ¨ Remove clutter and tripping hazards from floors and stairways.  ¨ Use grab bars in bathrooms and handrails by stairs.  ¨ Place non-slip mats on floors and in bathtubs.  ¨ Improve lighting in dim areas.  SEEK MEDICAL CARE IF:  · You have increased problems paying attention or  concentrating.  · You have increased difficulty remembering or learning new information.  · You need more time to complete tasks or assignments than before.  · You have increased irritability or decreased ability to cope with stress.  · You have more symptoms than before.  Seek medical care if you have any of the following symptoms for more than 2 weeks after your injury:  · Lasting (chronic) headaches.  · Dizziness or balance problems.  · Nausea.  · Vision problems.  · Increased sensitivity to noise or light.  · Depression or mood swings.  · Anxiety or irritability.  · Memory problems.  · Difficulty concentrating or paying attention.  · Sleep problems.  · Feeling tired all the time.  SEEK IMMEDIATE MEDICAL CARE IF:  · You have severe or worsening headaches. These may be a sign of a blood clot in the brain.  · You have weakness (even if only in one hand, leg, or part of the face).  · You have numbness.  · You have decreased coordination.  · You vomit repeatedly.  · You have increased sleepiness.  · One pupil is larger than the other.  · You have convulsions.  · You have slurred speech.  · You have increased confusion. This may be a sign of a blood clot in the brain.  · You have increased restlessness, agitation, or irritability.  · You are unable to recognize people or places.  · You have neck pain.  · It is difficult to wake you up.  · You have unusual behavior changes.  · You lose consciousness.  MAKE SURE YOU:  · Understand these instructions.  · Will watch your condition.  · Will get help right away if you are not doing well or get worse.  Document Released: 11/30/2003 Document Revised: 09/14/2013 Document Reviewed: 04/01/2013  [employer]® Patient Information ©2015 [employer]. This information is not intended to replace advice given to you by your health care provider. Make sure you discuss any questions you have with your health care provider.

## 2014-05-03 NOTE — Progress Notes (Signed)
  Subjective:    CC: Head injury F/U  HPI: Patient is a previously well 33 year old male who sustained a head injury 2 days ago when a garage door fell on his forehead. He did not lose conciousness, and does not report stars, though he states there was a great deal of blood. He went to urgent care and received a tetanus booster and was told that he had a concussion. The following day, he experienced, confusion, disorientation, dizziness, light-headedness, and nausea. He wants to know what to do about these things. Denies loss of memory, changes in vision or hearing.  Past medical history, Surgical history, Family history not pertinant except as noted below, Social history, Allergies, and medications have been entered into the medical record, reviewed, and no changes needed.   Review of Systems: No fevers, chills, night sweats, weight loss, chest pain, or shortness of breath. 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  Neuro: Alert and oriented x3, CN2-12 intact slightly reduced hearing on right side, cerebellum normal strength 5/5 in shoulder abduction, elbow flexion/extension, hip flexion/extension/abduction/adduction, knee extension/flexion. Patellar reflexes normal and equal. Balance impaired HEENT: Normocephalic, atraumatic, pupils equal round reactive to light, neck supple, no masses, no lymphadenopathy, thyroid nonpalpable. No papilledema Skin: Warm and dry, no rashes. Cardiac: Regular rate and rhythm, no murmurs rubs or gallops, no lower extremity edema.  Respiratory: Clear to auscultation bilaterally. Not using accessory muscles, speaking in full sentences. Wound: Clean, dry, intact. No visible indications of infection.  Impression and Recommendations:    Patient shows classic signs and symptoms of concussion. His neuro exam was nonfocal. No signs of a bleed. Patient was sent home  with instructions to take it easy and abstain from physical and mental stress.   Non-viable wound tissue was debrided, and wound was dressed with anti-microbial ointment, gauze and tagaderm. Patient was instructed in how to use tagaderm and sent home with some to use when he changes his dressing.Marland Kitchen

## 2014-05-03 NOTE — Assessment & Plan Note (Signed)
2 days post laceration. Incision is clean, dry, intact, I did remove and debrided some devitalized tissue and we redressed the wound. Return for suture removal in 5 days.

## 2014-05-03 NOTE — Assessment & Plan Note (Signed)
Classic symptoms, no focal neurologic signs. Out of work for a week, complete physical and cognitive rest, return to see me in 2 weeks.

## 2014-05-10 ENCOUNTER — Ambulatory Visit (INDEPENDENT_AMBULATORY_CARE_PROVIDER_SITE_OTHER): Payer: BC Managed Care – PPO | Admitting: Sports Medicine

## 2014-05-10 VITALS — BP 118/82 | HR 50 | Ht 72.0 in | Wt 268.0 lb

## 2014-05-10 DIAGNOSIS — S060X0A Concussion without loss of consciousness, initial encounter: Secondary | ICD-10-CM

## 2014-05-10 DIAGNOSIS — S0181XD Laceration without foreign body of other part of head, subsequent encounter: Secondary | ICD-10-CM

## 2014-05-10 DIAGNOSIS — S0180XA Unspecified open wound of other part of head, initial encounter: Secondary | ICD-10-CM

## 2014-05-10 DIAGNOSIS — S060X0D Concussion without loss of consciousness, subsequent encounter: Secondary | ICD-10-CM

## 2014-05-10 MED ORDER — HYDROXYZINE HCL 50 MG PO TABS: 50.0000 mg | ORAL_TABLET | Freq: Every evening | ORAL | Status: DC | PRN

## 2014-05-10 NOTE — Progress Notes (Signed)
   Subjective:    Patient ID: Michael Rubio, male    DOB: 10/30/1980, 33 y.o.   MRN: [id number]  HPI Patient presents today for suture removal as he is s/p 7 day head injury from a garage door coming down on his head. Sutures were removed with complication. Patient describes injuries related to his concussion with the most bothersome being lack of sleep. Margette Fast, CMA    Review of Systems     Objective:   Physical Exam        Assessment & Plan:

## 2014-05-10 NOTE — Assessment & Plan Note (Signed)
Continue physical and cognitive rest, adding hydroxyzine to help him sleep.  he does have a followup already arranged.

## 2014-05-10 NOTE — Assessment & Plan Note (Signed)
Okay for suture removal by nurse.

## 2014-05-10 NOTE — Progress Notes (Signed)
  Subjective:    CC: Followup  HPI: Concussion: Continues to have some cloudiness and dizziness and having difficulty sleeping however improved significantly and back to work. No focal symptoms. He is also here to have sutures removed.  Past medical history, Surgical history, Family history not pertinant except as noted below, Social history, Allergies, and medications have been entered into the medical record, reviewed, and no changes needed.   Review of Systems: No fevers, chills, night sweats, weight loss, chest pain, or shortness of breath.   Objective:    General: Well Developed, well nourished, and in no acute distress.  Neuro: Alert and oriented x3, extra-ocular muscles intact, sensation grossly intact. Cranial nerves II through XII are intact. HEENT: Normocephalic, atraumatic, pupils equal round reactive to light, neck supple, no masses, no lymphadenopathy, thyroid nonpalpable.  Skin: Warm and dry, no rashes. Incision is clean, dry, intact, and well-healed. Cardiac: Regular rate and rhythm, no murmurs rubs or gallops, no lower extremity edema.  Respiratory: Clear to auscultation bilaterally. Not using accessory muscles, speaking in full sentences.  Impression and Recommendations:

## 2014-05-11 ENCOUNTER — Ambulatory Visit: Payer: BC Managed Care – PPO

## 2014-05-17 ENCOUNTER — Ambulatory Visit (INDEPENDENT_AMBULATORY_CARE_PROVIDER_SITE_OTHER): Payer: BC Managed Care – PPO | Admitting: Sports Medicine

## 2014-05-17 ENCOUNTER — Encounter: Payer: Self-pay | Admitting: Sports Medicine

## 2014-05-17 VITALS — BP 134/83 | HR 75 | Ht 71.0 in | Wt 265.0 lb

## 2014-05-17 DIAGNOSIS — S060X0D Concussion without loss of consciousness, subsequent encounter: Secondary | ICD-10-CM

## 2014-05-17 DIAGNOSIS — S060X0A Concussion without loss of consciousness, initial encounter: Secondary | ICD-10-CM

## 2014-05-17 DIAGNOSIS — Z0000 Encounter for general adult medical examination without abnormal findings: Secondary | ICD-10-CM

## 2014-05-17 NOTE — Assessment & Plan Note (Signed)
Concussion continues to improve, switch hydroxyzine to one half tab near bedtime. Return as needed for this.

## 2014-05-17 NOTE — Progress Notes (Signed)
  Subjective:    CC: Followup  HPI: Concussion: Symptoms continue to resolve 2 weeks after injury. He has not been that compliant with physical and cognitive rest. Sleeping better with hydroxyzine at bedtime.  Laceration: Continues to heal well.  Preventative measures: He will make an appointment for a complete physical.  Past medical history, Surgical history, Family history not pertinant except as noted below, Social history, Allergies, and medications have been entered into the medical record, reviewed, and no changes needed.   Review of Systems: No fevers, chills, night sweats, weight loss, chest pain, or shortness of breath.   Objective:    General: Well Developed, well nourished, and in no acute distress.  Neuro: Alert and oriented x3, extra-ocular muscles intact, sensation grossly intact.  HEENT: Normocephalic, atraumatic, pupils equal round reactive to light, neck supple, no masses, no lymphadenopathy, thyroid nonpalpable.  Skin: Warm and dry, no rashes. Cardiac: Regular rate and rhythm, no murmurs rubs or gallops, no lower extremity edema.  Respiratory: Clear to auscultation bilaterally. Not using accessory muscles, speaking in full sentences.  Impression and Recommendations:

## 2014-05-17 NOTE — Assessment & Plan Note (Signed)
Checking routine blood work, return for complete physical examination.

## 2014-06-07 LAB — COMPREHENSIVE METABOLIC PANEL WITH GFR
Albumin: 4.3 g/dL (ref 3.5–5.2)
Alkaline Phosphatase: 57 U/L (ref 39–117)
BUN: 12 mg/dL (ref 6–23)
Calcium: 9.2 mg/dL (ref 8.4–10.5)
Chloride: 103 meq/L (ref 96–112)
Potassium: 3.9 meq/L (ref 3.5–5.3)
Sodium: 138 meq/L (ref 135–145)
Total Protein: 6.9 g/dL (ref 6.0–8.3)

## 2014-06-07 LAB — COMPREHENSIVE METABOLIC PANEL
ALT: 46 U/L (ref 0–53)
AST: 29 U/L (ref 0–37)
CO2: 24 mEq/L (ref 19–32)
Creat: 1.02 mg/dL (ref 0.50–1.35)
Glucose, Bld: 79 mg/dL (ref 70–99)
Total Bilirubin: 0.8 mg/dL (ref 0.2–1.2)

## 2014-06-07 LAB — CBC
HCT: 43.4 % (ref 39.0–52.0)
Hemoglobin: 15.6 g/dL (ref 13.0–17.0)
MCH: 29.2 pg (ref 26.0–34.0)
MCHC: 35.9 g/dL (ref 30.0–36.0)
MCV: 81.3 fL (ref 78.0–100.0)
Platelets: 231 10*3/uL (ref 150–400)
RBC: 5.34 MIL/uL (ref 4.22–5.81)
RDW: 13.8 % (ref 11.5–15.5)
WBC: 6.8 K/uL (ref 4.0–10.5)

## 2014-06-07 LAB — HEMOGLOBIN A1C
Hgb A1c MFr Bld: 5.3 % (ref <5.7)
Mean Plasma Glucose: 105 mg/dL (ref <117)

## 2014-06-07 LAB — TSH: TSH: 0.923 u[IU]/mL (ref 0.350–4.500)

## 2014-06-07 LAB — LIPID PANEL
Cholesterol: 178 mg/dL (ref 0–200)
HDL: 41 mg/dL (ref >39)
LDL Cholesterol: 108 mg/dL — ABNORMAL HIGH (ref 0–99)
Total CHOL/HDL Ratio: 4.3 Ratio
Triglycerides: 144 mg/dL (ref <150)
VLDL: 29 mg/dL (ref 0–40)

## 2014-06-09 ENCOUNTER — Encounter: Payer: Self-pay | Admitting: Sports Medicine

## 2014-06-09 ENCOUNTER — Ambulatory Visit (INDEPENDENT_AMBULATORY_CARE_PROVIDER_SITE_OTHER): Payer: BC Managed Care – PPO | Admitting: Sports Medicine

## 2014-06-09 VITALS — BP 132/85 | HR 82 | Ht 71.0 in | Wt 266.0 lb

## 2014-06-09 DIAGNOSIS — Z0000 Encounter for general adult medical examination without abnormal findings: Secondary | ICD-10-CM

## 2014-06-09 DIAGNOSIS — E669 Obesity, unspecified: Secondary | ICD-10-CM

## 2014-06-09 MED ORDER — PHENTERMINE HCL 37.5 MG PO CAPS: ORAL_CAPSULE | ORAL | Status: DC

## 2014-06-09 NOTE — Assessment & Plan Note (Addendum)
Starting phentermine, return monthly for weight checks and refills. If persistent excessive daytime sleepiness after weight loss we will further test for sleep apnea.

## 2014-06-09 NOTE — Progress Notes (Signed)
  Subjective:    CC: Complete physical  HPI:  Preventative measures: Complete physical performed today.  Obesity: Desires to try weight loss medication, minimal to dietary changes and lifestyle changes as well as an exercise prescription.  Insomnia: Able to sleep with the hydroxyzine however never feels well rested, has never been tested for sleep apnea.  Past medical history, Surgical history, Family history not pertinant except as noted below, Social history, Allergies, and medications have been entered into the medical record, reviewed, and no changes needed.   Review of Systems: No headache, visual changes, nausea, vomiting, diarrhea, constipation, dizziness, abdominal pain, skin rash, fevers, chills, night sweats, swollen lymph nodes, weight loss, chest pain, body aches, joint swelling, muscle aches, shortness of breath, mood changes, visual or auditory hallucinations.  Objective:    General: Well Developed, well nourished, and in no acute distress.  Neuro: Alert and oriented x3, extra-ocular muscles intact, sensation grossly intact. Cranial nerves II through XII are intact, motor, sensory, and coordinative functions are all intact. HEENT: Normocephalic, atraumatic, pupils equal round reactive to light, neck supple, no masses, no lymphadenopathy, thyroid nonpalpable. Oropharynx, nasopharynx, external ear canals are unremarkable. Skin: Warm and dry, no rashes noted.  Cardiac: Regular rate and rhythm, no murmurs rubs or gallops.  Respiratory: Clear to auscultation bilaterally. Not using accessory muscles, speaking in full sentences.  Abdominal: Soft, nontender, nondistended, positive bowel sounds, no masses, no organomegaly.  Musculoskeletal: Shoulder, elbow, wrist, hip, knee, ankle stable, and with full range of motion.  Impression and Recommendations:    The patient was counselled, risk factors were discussed, anticipatory guidance given.

## 2014-06-09 NOTE — Assessment & Plan Note (Signed)
Annual physical today.

## 2014-07-08 ENCOUNTER — Ambulatory Visit (INDEPENDENT_AMBULATORY_CARE_PROVIDER_SITE_OTHER): Payer: BC Managed Care – PPO | Admitting: Sports Medicine

## 2014-07-08 VITALS — BP 121/84 | HR 74 | Ht 72.0 in | Wt 251.0 lb

## 2014-07-08 DIAGNOSIS — E669 Obesity, unspecified: Secondary | ICD-10-CM | POA: Diagnosis not present

## 2014-07-08 MED ORDER — PHENTERMINE HCL 37.5 MG PO CAPS: ORAL_CAPSULE | ORAL | Status: DC

## 2014-07-08 MED ORDER — TRIAMCINOLONE ACETONIDE 0.5 % EX CREA: 1.0000 "application " | TOPICAL_CREAM | Freq: Two times a day (BID) | CUTANEOUS | Status: DC

## 2014-07-08 NOTE — Assessment & Plan Note (Signed)
15 pound weight loss after one month. Refill and phentermine. Return in one month.

## 2014-07-08 NOTE — Progress Notes (Signed)
  Subjective:    CC: Followup  HPI: Obesity: 15 pound weight loss in a month.  Skin rash: Pruritic, on his right and left arms, papular, he does tend to pick at the lesions.  Past medical history, Surgical history, Family history not pertinant except as noted below, Social history, Allergies, and medications have been entered into the medical record, reviewed, and no changes needed.   Review of Systems: No fevers, chills, night sweats, weight loss, chest pain, or shortness of breath.   Objective:    General: Well Developed, well nourished, and in no acute distress.  Neuro: Alert and oriented x3, extra-ocular muscles intact, sensation grossly intact.  HEENT: Normocephalic, atraumatic, pupils equal round reactive to light, neck supple, no masses, no lymphadenopathy, thyroid nonpalpable.  Skin: Warm and dry, there are several papular lesions on his right and left forearms that are excoriated.. Cardiac: Regular rate and rhythm, no murmurs rubs or gallops, no lower extremity edema.  Respiratory: Clear to auscultation bilaterally. Not using accessory muscles, speaking in full sentences.  Impression and Recommendations:

## 2014-08-05 ENCOUNTER — Ambulatory Visit (INDEPENDENT_AMBULATORY_CARE_PROVIDER_SITE_OTHER): Payer: BC Managed Care – PPO | Admitting: Sports Medicine

## 2014-08-05 ENCOUNTER — Encounter: Payer: Self-pay | Admitting: Sports Medicine

## 2014-08-05 VITALS — BP 122/80 | HR 75 | Ht 72.0 in | Wt 236.0 lb

## 2014-08-05 DIAGNOSIS — E669 Obesity, unspecified: Secondary | ICD-10-CM

## 2014-08-05 MED ORDER — PHENTERMINE HCL 37.5 MG PO CAPS: ORAL_CAPSULE | ORAL | Status: DC

## 2014-08-05 NOTE — Progress Notes (Signed)
  Subjective:    CC: recheck weight  HPI: Michael Rubio returns, he has been losing a solid 15 pounds every month, no side effects, eager to continue medication.  Past medical history, Surgical history, Family history not pertinant except as noted below, Social history, Allergies, and medications have been entered into the medical record, reviewed, and no changes needed.   Review of Systems: No fevers, chills, night sweats, weight loss, chest pain, or shortness of breath.   Objective:    General: Well Developed, well nourished, and in no acute distress.  Neuro: Alert and oriented x3, extra-ocular muscles intact, sensation grossly intact.  HEENT: Normocephalic, atraumatic, pupils equal round reactive to light, neck supple, no masses, no lymphadenopathy, thyroid nonpalpable.  Skin: Warm and dry, no rashes. Cardiac: Regular rate and rhythm, no murmurs rubs or gallops, no lower extremity edema.  Respiratory: Clear to auscultation bilaterally. Not using accessory muscles, speaking in full sentences.  Impression and Recommendations:

## 2014-08-05 NOTE — Assessment & Plan Note (Signed)
Excellent continued weight loss. Refilling phentermine. Next line return in one month.

## 2014-09-02 ENCOUNTER — Ambulatory Visit (INDEPENDENT_AMBULATORY_CARE_PROVIDER_SITE_OTHER): Payer: BC Managed Care – PPO | Admitting: Sports Medicine

## 2014-09-02 ENCOUNTER — Encounter: Payer: Self-pay | Admitting: Sports Medicine

## 2014-09-02 VITALS — BP 113/78 | HR 78 | Ht 72.0 in | Wt 229.0 lb

## 2014-09-02 DIAGNOSIS — E669 Obesity, unspecified: Secondary | ICD-10-CM | POA: Diagnosis not present

## 2014-09-02 MED ORDER — PHENTERMINE HCL 37.5 MG PO CAPS: ORAL_CAPSULE | ORAL | Status: DC

## 2014-09-02 NOTE — Assessment & Plan Note (Signed)
Excellent continued weight loss after 3 months. Refilling phentermine.

## 2014-09-02 NOTE — Progress Notes (Signed)
  Subjective:    CC: Weight check  HPI: Michael Rubio continues to lose weight, he has lost approximately 40 pounds so far, happy with results so far and has only been doing phentermine for 3 months now.  Past medical history, Surgical history, Family history not pertinant except as noted below, Social history, Allergies, and medications have been entered into the medical record, reviewed, and no changes needed.   Review of Systems: No fevers, chills, night sweats, weight loss, chest pain, or shortness of breath.   Objective:    General: Well Developed, well nourished, and in no acute distress.  Neuro: Alert and oriented x3, extra-ocular muscles intact, sensation grossly intact.  HEENT: Normocephalic, atraumatic, pupils equal round reactive to light, neck supple, no masses, no lymphadenopathy, thyroid nonpalpable.  Skin: Warm and dry, no rashes. Cardiac: Regular rate and rhythm, no murmurs rubs or gallops, no lower extremity edema.  Respiratory: Clear to auscultation bilaterally. Not using accessory muscles, speaking in full sentences.  Impression and Recommendations:

## 2014-09-30 ENCOUNTER — Ambulatory Visit: Payer: BC Managed Care – PPO | Admitting: Sports Medicine

## 2014-10-07 ENCOUNTER — Encounter: Payer: Self-pay | Admitting: Sports Medicine

## 2014-10-07 ENCOUNTER — Ambulatory Visit (INDEPENDENT_AMBULATORY_CARE_PROVIDER_SITE_OTHER): Payer: Self-pay | Admitting: Sports Medicine

## 2014-10-07 VITALS — BP 120/78 | HR 83 | Ht 71.0 in | Wt 222.0 lb

## 2014-10-07 DIAGNOSIS — E669 Obesity, unspecified: Secondary | ICD-10-CM

## 2014-10-07 MED ORDER — PHENTERMINE HCL 37.5 MG PO CAPS: ORAL_CAPSULE | ORAL | Status: DC

## 2014-10-07 NOTE — Progress Notes (Signed)
  Subjective:    CC: Obesity  HPI: Kalee finished his fourth month of phentermine, continues to lose weight, happy with results so far, no side effects.  Past medical history, Surgical history, Family history not pertinant except as noted below, Social history, Allergies, and medications have been entered into the medical record, reviewed, and no changes needed.   Review of Systems: No fevers, chills, night sweats, weight loss, chest pain, or shortness of breath.   Objective:    General: Well Developed, well nourished, and in no acute distress.  Neuro: Alert and oriented x3, extra-ocular muscles intact, sensation grossly intact.  HEENT: Normocephalic, atraumatic, pupils equal round reactive to light, neck supple, no masses, no lymphadenopathy, thyroid nonpalpable.  Skin: Warm and dry, no rashes. Cardiac: Regular rate and rhythm, no murmurs rubs or gallops, no lower extremity edema.  Respiratory: Clear to auscultation bilaterally. Not using accessory muscles, speaking in full sentences.  Impression and Recommendations:

## 2014-10-07 NOTE — Assessment & Plan Note (Signed)
Good continued weight loss after 4 months. Refilling medication, return in 1 month.

## 2014-11-04 ENCOUNTER — Encounter: Payer: Self-pay | Admitting: Sports Medicine

## 2014-11-04 ENCOUNTER — Ambulatory Visit (INDEPENDENT_AMBULATORY_CARE_PROVIDER_SITE_OTHER): Payer: BLUE CROSS/BLUE SHIELD | Admitting: Sports Medicine

## 2014-11-04 VITALS — BP 117/78 | HR 61 | Ht 71.0 in | Wt 220.0 lb

## 2014-11-04 DIAGNOSIS — E669 Obesity, unspecified: Secondary | ICD-10-CM | POA: Diagnosis not present

## 2014-11-04 DIAGNOSIS — M5416 Radiculopathy, lumbar region: Secondary | ICD-10-CM | POA: Diagnosis not present

## 2014-11-04 MED ORDER — PHENTERMINE HCL 37.5 MG PO CAPS: ORAL_CAPSULE | ORAL | Status: DC

## 2014-11-04 NOTE — Assessment & Plan Note (Signed)
2 pound continued weight loss. As we enter the six-month we will continue his phentermine and then dropped down to a half dose afterwards.

## 2014-11-04 NOTE — Progress Notes (Signed)
  Subjective:    CC: Follow-up  HPI: Obesity: After 5 months continued weight loss, would like to do a single additional month.  Lumbar spondylosis: With right-sided chronic S1 radiculopathy, has had chiropractic care but no physical therapy, or injections. He does have significant right-sided paralumbar spasm with a discrete area of pain today. No bowel or bladder dysfunction or saddle numbness.  Past medical history, Surgical history, Family history not pertinant except as noted below, Social history, Allergies, and medications have been entered into the medical record, reviewed, and no changes needed.   Review of Systems: No fevers, chills, night sweats, weight loss, chest pain, or shortness of breath.   Objective:    General: Well Developed, well nourished, and in no acute distress.  Neuro: Alert and oriented x3, extra-ocular muscles intact, sensation grossly intact.  HEENT: Normocephalic, atraumatic, pupils equal round reactive to light, neck supple, no masses, no lymphadenopathy, thyroid nonpalpable.  Skin: Warm and dry, no rashes. Cardiac: Regular rate and rhythm, no murmurs rubs or gallops, no lower extremity edema.  Respiratory: Clear to auscultation bilaterally. Not using accessory muscles, speaking in full sentences. Back Exam:  Inspection: Unremarkable  Motion: Flexion 45 deg, Extension 45 deg, Side Bending to 45 deg bilaterally,  Rotation to 45 deg bilaterally  SLR laying: Negative  XSLR laying: Negative  Palpable tenderness: There is a palpable area of muscle spasm in the right paralumbar muscles. FABER: negative. Sensory change: Gross sensation intact to all lumbar and sacral dermatomes.  Reflexes: 2+ at both patellar tendons, 2+ at achilles tendons, Babinski's downgoing.  Strength at foot  Plantar-flexion: 5/5 Dorsi-flexion: 5/5 Eversion: 5/5 Inversion: 5/5  Leg strength  Quad: 5/5 Hamstring: 5/5 Hip flexor: 5/5 Hip abductors: 5/5  Gait unremarkable.  Procedure:   Injection of right paralumbar trigger point Consent obtained and verified. Time-out conducted. Noted no overlying erythema, induration, or other signs of local infection. Skin prepped in a sterile fashion. Topical analgesic spray: Ethyl chloride. Completed without difficulty. Meds: 1 mL kenalog 40, 4 mL lidocaine injected into and around the trigger point. Pain immediately improved suggesting accurate placement of the medication. Advised to call if fevers/chills, erythema, induration, drainage, or persistent bleeding.  Impression and Recommendations:

## 2014-11-04 NOTE — Assessment & Plan Note (Signed)
Trigger point injection into spasming right paralumbar muscle. Rehabilitation exercises given. If no better, return for MRI.

## 2014-12-09 ENCOUNTER — Ambulatory Visit: Payer: BLUE CROSS/BLUE SHIELD | Admitting: Sports Medicine

## 2015-11-10 ENCOUNTER — Ambulatory Visit (INDEPENDENT_AMBULATORY_CARE_PROVIDER_SITE_OTHER): Payer: 59 | Admitting: Sports Medicine

## 2015-11-10 ENCOUNTER — Ambulatory Visit (INDEPENDENT_AMBULATORY_CARE_PROVIDER_SITE_OTHER): Payer: 59

## 2015-11-10 ENCOUNTER — Encounter: Payer: Self-pay | Admitting: Sports Medicine

## 2015-11-10 DIAGNOSIS — M545 Low back pain: Secondary | ICD-10-CM

## 2015-11-10 DIAGNOSIS — M5416 Radiculopathy, lumbar region: Secondary | ICD-10-CM | POA: Diagnosis not present

## 2015-11-10 DIAGNOSIS — E669 Obesity, unspecified: Secondary | ICD-10-CM

## 2015-11-10 MED ORDER — KETOROLAC TROMETHAMINE 30 MG/ML IJ SOLN
30.0000 mg | Freq: Once | INTRAMUSCULAR | Status: AC
  Administered 2015-11-10: 30 mg via INTRAMUSCULAR

## 2015-11-10 MED ORDER — PHENTERMINE HCL 37.5 MG PO TABS: ORAL_TABLET | ORAL | Status: DC

## 2015-11-10 MED ORDER — CYCLOBENZAPRINE HCL 10 MG PO TABS: ORAL_TABLET | ORAL | Status: DC

## 2015-11-10 MED ORDER — MELOXICAM 15 MG PO TABS: ORAL_TABLET | ORAL | Status: DC

## 2015-11-10 MED ORDER — METHYLPREDNISOLONE SODIUM SUCC 125 MG IJ SOLR
125.0000 mg | Freq: Once | INTRAMUSCULAR | Status: AC
  Administered 2015-11-10: 125 mg via INTRAMUSCULAR

## 2015-11-10 MED ORDER — PREDNISONE 50 MG PO TABS: ORAL_TABLET | ORAL | Status: DC

## 2015-11-10 NOTE — Assessment & Plan Note (Signed)
Restarting phentermine. Return in one month.

## 2015-11-10 NOTE — Progress Notes (Signed)
  Subjective:    CC: Back pain  HPI: This is a pleasant 35 year old male, we've treated him for lumbar radiculopathy in the past, pain is moderate, persistent. Radiating down the right leg, worse with sitting, flexion, Valsalva, no bowel or bladder distention, saddle numbness, no constitutional symptoms.  Obesity: Desires to restart weight loss treatment.  Past medical history, Surgical history, Family history not pertinant except as noted below, Social history, Allergies, and medications have been entered into the medical record, reviewed, and no changes needed.   Review of Systems: No fevers, chills, night sweats, weight loss, chest pain, or shortness of breath.   Objective:    General: Well Developed, well nourished, and in no acute distress.  Neuro: Alert and oriented x3, extra-ocular muscles intact, sensation grossly intact.  HEENT: Normocephalic, atraumatic, pupils equal round reactive to light, neck supple, no masses, no lymphadenopathy, thyroid nonpalpable.  Skin: Warm and dry, no rashes. Cardiac: Regular rate and rhythm, no murmurs rubs or gallops, no lower extremity edema.  Respiratory: Clear to auscultation bilaterally. Not using accessory muscles, speaking in full sentences. Back Exam:  Inspection: Unremarkable  Motion: Flexion 45 deg, Extension 45 deg, Side Bending to 45 deg bilaterally,  Rotation to 45 deg bilaterally  SLR laying: Positive with reproduction of right-sided radicular symptoms.  XSLR laying: Negative  Palpable tenderness: None. FABER: negative. Sensory change: Gross sensation intact to all lumbar and sacral dermatomes.  Reflexes: 2+ at both patellar tendons, 2+ at achilles tendons, Babinski's downgoing.  Strength at foot  Plantar-flexion: 5/5 Dorsi-flexion: 5/5 Eversion: 5/5 Inversion: 5/5  Leg strength  Quad: 5/5 Hamstring: 5/5 Hip flexor: 5/5 Hip abductors: 5/5  Gait unremarkable.  Impression and Recommendations:

## 2015-11-10 NOTE — Assessment & Plan Note (Signed)
Recurrence of pain, x-rays, Toradol, Solu-Medrol. Prednisone, meloxicam, Flexeril, rehabilitation exercises.  Return in one month, MRI for interventional planning if no better.

## 2015-12-08 ENCOUNTER — Ambulatory Visit (INDEPENDENT_AMBULATORY_CARE_PROVIDER_SITE_OTHER): Payer: 59 | Admitting: Sports Medicine

## 2015-12-08 ENCOUNTER — Encounter: Payer: Self-pay | Admitting: Sports Medicine

## 2015-12-08 VITALS — BP 113/78 | HR 96 | Ht 71.0 in | Wt 242.0 lb

## 2015-12-08 DIAGNOSIS — E669 Obesity, unspecified: Secondary | ICD-10-CM | POA: Diagnosis not present

## 2015-12-08 DIAGNOSIS — Z0000 Encounter for general adult medical examination without abnormal findings: Secondary | ICD-10-CM | POA: Diagnosis not present

## 2015-12-08 DIAGNOSIS — M5416 Radiculopathy, lumbar region: Secondary | ICD-10-CM | POA: Diagnosis not present

## 2015-12-08 MED ORDER — PHENTERMINE HCL 37.5 MG PO TABS: ORAL_TABLET | ORAL | Status: DC

## 2015-12-08 NOTE — Progress Notes (Signed)
  Subjective:    CC:  Follow-up  HPI: Lumbar radiculopathy: Resolved with conservative measures.  Obesity: Moderate weight loss after the first month on phentermine  Preventive measures: Due for a complete physical.  Past medical history, Surgical history, Family history not pertinant except as noted below, Social history, Allergies, and medications have been entered into the medical record, reviewed, and no changes needed.   Review of Systems: No fevers, chills, night sweats, weight loss, chest pain, or shortness of breath.   Objective:    General: Well Developed, well nourished, and in no acute distress.  Neuro: Alert and oriented x3, extra-ocular muscles intact, sensation grossly intact.  HEENT: Normocephalic, atraumatic, pupils equal round reactive to light, neck supple, no masses, no lymphadenopathy, thyroid nonpalpable.  Skin: Warm and dry, no rashes. Cardiac: Regular rate and rhythm, no murmurs rubs or gallops, no lower extremity edema.  Respiratory: Clear to auscultation bilaterally. Not using accessory muscles, speaking in full sentences.  Impression and Recommendations:

## 2015-12-08 NOTE — Assessment & Plan Note (Signed)
Good weight loss, refilling medication as we enter the second month.

## 2015-12-08 NOTE — Assessment & Plan Note (Signed)
Return in one month, we can do that we check and the physical exam on the same day.

## 2015-12-08 NOTE — Assessment & Plan Note (Signed)
Resolved with conservative measures. 

## 2016-01-25 ENCOUNTER — Ambulatory Visit (INDEPENDENT_AMBULATORY_CARE_PROVIDER_SITE_OTHER): Payer: 59 | Admitting: Sports Medicine

## 2016-01-25 ENCOUNTER — Encounter: Payer: Self-pay | Admitting: Sports Medicine

## 2016-01-25 VITALS — BP 107/68 | HR 76 | Resp 18 | Wt 228.0 lb

## 2016-01-25 DIAGNOSIS — E669 Obesity, unspecified: Secondary | ICD-10-CM

## 2016-01-25 DIAGNOSIS — T148 Other injury of unspecified body region: Secondary | ICD-10-CM

## 2016-01-25 DIAGNOSIS — Z0000 Encounter for general adult medical examination without abnormal findings: Secondary | ICD-10-CM

## 2016-01-25 DIAGNOSIS — W57XXXA Bitten or stung by nonvenomous insect and other nonvenomous arthropods, initial encounter: Secondary | ICD-10-CM | POA: Diagnosis not present

## 2016-01-25 LAB — LIPID PANEL
Cholesterol: 157 mg/dL (ref 125–200)
HDL: 48 mg/dL (ref >=40)
LDL Cholesterol: 97 mg/dL (ref <130)
Total CHOL/HDL Ratio: 3.3 Ratio (ref <=5.0)
Triglycerides: 62 mg/dL (ref <150)
VLDL: 12 mg/dL (ref <30)

## 2016-01-25 LAB — CBC
HCT: 46.7 % (ref 38.5–50.0)
Hemoglobin: 16.3 g/dL (ref 13.2–17.1)
MCH: 30.3 pg (ref 27.0–33.0)
MCHC: 34.9 g/dL (ref 32.0–36.0)
MCV: 86.8 fL (ref 80.0–100.0)
MPV: 10.2 fL (ref 7.5–12.5)
Platelets: 231 10*3/uL (ref 140–400)
RBC: 5.38 MIL/uL (ref 4.20–5.80)
RDW: 13.7 % (ref 11.0–15.0)
WBC: 4.9 10*3/uL (ref 3.8–10.8)

## 2016-01-25 LAB — COMPREHENSIVE METABOLIC PANEL
ALT: 23 U/L (ref 9–46)
AST: 20 U/L (ref 10–40)
Alkaline Phosphatase: 39 U/L — ABNORMAL LOW (ref 40–115)
BUN: 12 mg/dL (ref 7–25)
CO2: 25 mmol/L (ref 20–31)
Calcium: 9.2 mg/dL (ref 8.6–10.3)
Glucose, Bld: 89 mg/dL (ref 65–99)
Sodium: 138 mmol/L (ref 135–146)
Total Bilirubin: 0.7 mg/dL (ref 0.2–1.2)

## 2016-01-25 LAB — COMPREHENSIVE METABOLIC PANEL WITH GFR
Albumin: 4.4 g/dL (ref 3.6–5.1)
Chloride: 104 mmol/L (ref 98–110)
Creat: 0.95 mg/dL (ref 0.60–1.35)
Potassium: 4.4 mmol/L (ref 3.5–5.3)
Total Protein: 6.7 g/dL (ref 6.1–8.1)

## 2016-01-25 LAB — HEMOGLOBIN A1C
Hgb A1c MFr Bld: 5.1 % (ref <5.7)
Mean Plasma Glucose: 100 mg/dL

## 2016-01-25 LAB — TSH: TSH: 0.6 mIU/L (ref 0.40–4.50)

## 2016-01-25 MED ORDER — PHENTERMINE HCL 37.5 MG PO TABS: ORAL_TABLET | ORAL | Status: DC

## 2016-01-25 NOTE — Assessment & Plan Note (Addendum)
Per patient takes her likely attached for more than 24 hours, no symptoms of tick born illness yet however his lymph nodes are swollen. Checking tick serologies. Per his report the tick description matches the [employer] tick.

## 2016-01-25 NOTE — Progress Notes (Signed)
  Subjective:    CC: Complete physical   HPI:  This is a pleasant 35 year old male with no complaints, he is here for his physical.  Obesity: Has lost fantastic weight after the second month of phentermine.  Tick bite: Occurred sometime ago, takes for likely on for greater than 24 hours, he has no myalgias or fatigue but does have some lymphadenopathy in the axilla near his tick bite. He did examine the tick and it had a single white spots.  Past medical history, Surgical history, Family history not pertinant except as noted below, Social history, Allergies, and medications have been entered into the medical record, reviewed, and no changes needed.   Review of Systems: No headache, visual changes, nausea, vomiting, diarrhea, constipation, dizziness, abdominal pain, skin rash, fevers, chills, night sweats, swollen lymph nodes, weight loss, chest pain, body aches, joint swelling, muscle aches, shortness of breath, mood changes, visual or auditory hallucinations.  Objective:    General: Well Developed, well nourished, and in no acute distress.  Neuro: Alert and oriented x3, extra-ocular muscles intact, sensation grossly intact. Cranial nerves II through XII are intact, motor, sensory, and coordinative functions are all intact. HEENT: Normocephalic, atraumatic, pupils equal round reactive to light, neck supple, no masses, no lymphadenopathy, thyroid nonpalpable. Oropharynx, nasopharynx, external ear canals are unremarkable. Skin: Warm and dry, no rashes noted. There is slight erythema and a nodule at the site of the tick bites but no evidence of erythema chronicum migrans. No other skin rashes. Cardiac: Regular rate and rhythm, no murmurs rubs or gallops.  Respiratory: Clear to auscultation bilaterally. Not using accessory muscles, speaking in full sentences.  Abdominal: Soft, nontender, nondistended, positive bowel sounds, no masses, no organomegaly.  Musculoskeletal: Shoulder, elbow, wrist,  hip, knee, ankle stable, and with full range of motion.  Impression and Recommendations:    The patient was counselled, risk factors were discussed, anticipatory guidance given.

## 2016-01-25 NOTE — Assessment & Plan Note (Signed)
Physical as above. Routine blood work ordered.

## 2016-01-25 NOTE — Assessment & Plan Note (Signed)
Fantastic continued weight loss after 2 months of phentermine, refilling medications.

## 2016-01-26 LAB — VITAMIN D 25 HYDROXY (VIT D DEFICIENCY, FRACTURES): Vit D, 25-Hydroxy: 27 ng/mL — ABNORMAL LOW (ref 30–100)

## 2016-01-26 LAB — ANTI-SCLERODERMA ANTIBODY: Scleroderma (Scl-70) (ENA) Antibody, IgG: <1

## 2016-01-26 LAB — ANTI-RIBONUCLEIC ACID ANTIBODY: SM/RNP: <1

## 2016-01-26 LAB — LYME AB/WESTERN BLOT REFLEX: B burgdorferi Ab IgG+IgM: <0.9 Index (ref <0.90)

## 2016-01-26 LAB — ANTI-DNA ANTIBODY, DOUBLE-STRANDED: ds DNA Ab: <1 [IU]/mL

## 2016-01-26 LAB — SJOGRENS SYNDROME-A EXTRACTABLE NUCLEAR ANTIBODY: SSA (Ro) (ENA) Antibody, IgG: <1

## 2016-01-26 LAB — ANTI-SMITH ANTIBODY: ENA SM Ab Ser-aCnc: <1

## 2016-01-26 LAB — JO-1 ANTIBODY-IGG: Jo-1 Antibody, IgG: <1

## 2016-01-26 LAB — SJOGRENS SYNDROME-B EXTRACTABLE NUCLEAR ANTIBODY: SSB (La) (ENA) Antibody, IgG: <1

## 2016-01-27 LAB — EHRLICHIA ANTIBODY PANEL
E chaffeensis (HGE) Ab, IgG: <1:64 {titer}
E chaffeensis (HGE) Ab, IgM: <1:20 {titer}

## 2016-01-29 LAB — ROCKY MTN SPOTTED FVR ABS PNL(IGG+IGM)
RMSF IgG: DETECTED — AB
RMSF IgM: NOT DETECTED

## 2016-01-29 LAB — REFLEX RMSF IGG TITER: RMSF IgG Titer: 1:64 {titer} — ABNORMAL HIGH

## 2016-01-30 MED ORDER — DOXYCYCLINE HYCLATE 100 MG PO TABS: 100.0000 mg | ORAL_TABLET | Freq: Two times a day (BID) | ORAL | Status: AC

## 2016-01-30 NOTE — Addendum Note (Signed)
Addended by: Silverio Decamp on: 01/30/2016 04:47 PM   Modules accepted: Orders

## 2016-02-19 ENCOUNTER — Encounter: Payer: Self-pay | Admitting: Emergency Medicine

## 2016-02-19 ENCOUNTER — Emergency Department
Admission: EM | Admit: 2016-02-19 | Discharge: 2016-02-19 | Disposition: A | Discharge status: Home / Self Care | Payer: 59 | Source: Home / Self Care | Attending: Family Medicine | Admitting: Family Medicine

## 2016-02-19 DIAGNOSIS — R1031 Right lower quadrant pain: Secondary | ICD-10-CM

## 2016-02-19 DIAGNOSIS — R110 Nausea: Secondary | ICD-10-CM

## 2016-02-19 NOTE — ED Notes (Signed)
Pt was sent to [facility] hospital to r/o appendicitis. Pt is stable.

## 2016-02-19 NOTE — ED Provider Notes (Signed)
Filed Vitals:   02/19/16 1819  BP: 124/78  Pulse: 78  Temp: 98.3 F (36.8 C)    Pt c/o sudden onset, suddenly worsening RLQ abdominal pain with associated nausea. Pt pointing to McBurney's point. Denies fever, chills, upper abdominal pain, back pain, urinary symptoms or hx of kidney stones. Pain is 8/10, aching, sore and sharp.   Exam: pt appears uncomfortable, holding his right lower abdomen, appears to be on verge of tears.  Abdomen- soft, tenderness at McBurney's point with guarding.  Concern for appendicitis.  No CT available at this facility at this time and no strong pain medication. Encouraged pt to go directly to emergency department for further evaluation and treatment. Pt declined EMS transport. Wife accompanying pt feels comfortable driving pt POV to [facility].  [facility] notified of pt's pending arrival.    Noland Fordyce, Hershal Coria 02/19/16 [id number]

## 2016-02-19 NOTE — ED Notes (Signed)
Pt c/o right sided lower quadrant abdominal pain that started suddenly yesterday.

## 2016-02-20 ENCOUNTER — Telehealth: Payer: Self-pay | Admitting: *Deleted

## 2016-02-26 ENCOUNTER — Encounter: Payer: Self-pay | Admitting: Sports Medicine

## 2016-02-26 ENCOUNTER — Ambulatory Visit (INDEPENDENT_AMBULATORY_CARE_PROVIDER_SITE_OTHER): Payer: 59

## 2016-02-26 ENCOUNTER — Ambulatory Visit (INDEPENDENT_AMBULATORY_CARE_PROVIDER_SITE_OTHER): Payer: 59 | Admitting: Sports Medicine

## 2016-02-26 VITALS — BP 115/73 | HR 67 | Resp 16 | Wt 232.7 lb

## 2016-02-26 DIAGNOSIS — C642 Malignant neoplasm of left kidney, except renal pelvis: Secondary | ICD-10-CM | POA: Insufficient documentation

## 2016-02-26 DIAGNOSIS — R1031 Right lower quadrant pain: Secondary | ICD-10-CM

## 2016-02-26 DIAGNOSIS — N289 Disorder of kidney and ureter, unspecified: Secondary | ICD-10-CM | POA: Diagnosis not present

## 2016-02-26 DIAGNOSIS — N2889 Other specified disorders of kidney and ureter: Secondary | ICD-10-CM

## 2016-02-26 LAB — CBC WITH DIFFERENTIAL/PLATELET
Basophils Absolute: 0 cells/uL (ref 0–200)
Basophils Relative: 0 %
Eosinophils Absolute: 67 cells/uL (ref 15–500)
Eosinophils Relative: 1 %
HCT: 45 % (ref 38.5–50.0)
Hemoglobin: 15.5 g/dL (ref 13.2–17.1)
Lymphocytes Relative: 29 %
Lymphs Abs: 1943 {cells}/uL (ref 850–3900)
MCH: 29.5 pg (ref 27.0–33.0)
MCHC: 34.4 g/dL (ref 32.0–36.0)
MCV: 85.6 fL (ref 80.0–100.0)
MPV: 10.2 fL (ref 7.5–12.5)
Monocytes Absolute: 469 {cells}/uL (ref 200–950)
Monocytes Relative: 7 %
Neutro Abs: 4221 {cells}/uL (ref 1500–7800)
Neutrophils Relative %: 63 %
Platelets: 223 K/uL (ref 140–400)
RBC: 5.26 MIL/uL (ref 4.20–5.80)
RDW: 13.2 % (ref 11.0–15.0)
WBC: 6.7 K/uL (ref 3.8–10.8)

## 2016-02-26 MED ORDER — IOPAMIDOL (ISOVUE-300) INJECTION 61%
100.0000 mL | Freq: Once | INTRAVENOUS | Status: AC | PRN
  Administered 2016-02-26: 100 mL via INTRAVENOUS

## 2016-02-26 NOTE — Progress Notes (Signed)
  Subjective:    CC: acute abdominal pain  HPI: This is a pleasant 35 year old male, he was seen in 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 He returns today for a hospital follow-up with persistent but mild pain in the right lower quadrant. No nausea, vomiting, diarrhea, constipation, no constitutional symptoms.  Past medical history, Surgical history, Family history not pertinant except as noted below, Social history, Allergies, and medications have been entered into the medical record, reviewed, and no changes needed.   Review of Systems: No fevers, chills, night sweats, weight loss, chest pain, or shortness of breath.   Objective:    General: Well Developed, well nourished, and in no acute distress.  Neuro: Alert and oriented x3, extra-ocular muscles intact, sensation grossly intact.  HEENT: Normocephalic, atraumatic, pupils equal round reactive to light, neck supple, no masses, no lymphadenopathy, thyroid nonpalpable.  Skin: Warm and dry, no rashes. Cardiac: Regular rate and rhythm, no murmurs rubs or gallops, no lower extremity edema.  Respiratory: Clear to auscultation bilaterally. Not using accessory muscles, speaking in full sentences. Abdomen: Soft, tender to palpation in the right lower quadrant exquisitely with guarding, rigidity, and rebound tenderness in this location.  Impression and Recommendations:

## 2016-02-26 NOTE — Assessment & Plan Note (Signed)
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 His pain continues to improve but he still has significant guarding, rigidity, and rebound tenderness in the right lower quadrant. For this reason we are going to get follow-up CT of the abdomen and pelvis with oral and IV contrast, and check a CBC with differential.

## 2016-02-27 LAB — COMPREHENSIVE METABOLIC PANEL
ALT: 31 U/L (ref 9–46)
AST: 18 U/L (ref 10–40)
Alkaline Phosphatase: 45 U/L (ref 40–115)
BUN: 11 mg/dL (ref 7–25)
Glucose, Bld: 91 mg/dL (ref 65–99)
Sodium: 139 mmol/L (ref 135–146)
Total Bilirubin: 0.5 mg/dL (ref 0.2–1.2)

## 2016-02-27 LAB — COMPREHENSIVE METABOLIC PANEL WITH GFR
Albumin: 4.4 g/dL (ref 3.6–5.1)
CO2: 26 mmol/L (ref 20–31)
Calcium: 9.4 mg/dL (ref 8.6–10.3)
Chloride: 102 mmol/L (ref 98–110)
Creat: 0.99 mg/dL (ref 0.60–1.35)
Potassium: 4.3 mmol/L (ref 3.5–5.3)
Total Protein: 6.7 g/dL (ref 6.1–8.1)

## 2016-02-27 LAB — LIPASE: Lipase: 19 U/L (ref 7–60)

## 2016-03-01 ENCOUNTER — Ambulatory Visit: Payer: 59 | Admitting: Sports Medicine

## 2016-03-11 ENCOUNTER — Other Ambulatory Visit: Payer: Self-pay | Admitting: Sports Medicine

## 2016-03-11 ENCOUNTER — Ambulatory Visit: Payer: 59

## 2016-03-11 ENCOUNTER — Ambulatory Visit (INDEPENDENT_AMBULATORY_CARE_PROVIDER_SITE_OTHER): Payer: 59

## 2016-03-11 DIAGNOSIS — Z01818 Encounter for other preprocedural examination: Secondary | ICD-10-CM

## 2016-03-11 DIAGNOSIS — N2889 Other specified disorders of kidney and ureter: Secondary | ICD-10-CM

## 2016-03-11 DIAGNOSIS — Z1389 Encounter for screening for other disorder: Secondary | ICD-10-CM

## 2016-03-11 MED ORDER — GADOBENATE DIMEGLUMINE 529 MG/ML IV SOLN
20.0000 mL | Freq: Once | INTRAVENOUS | Status: AC | PRN
  Administered 2016-03-11: 20 mL via INTRAVENOUS

## 2016-03-25 ENCOUNTER — Other Ambulatory Visit: Payer: Self-pay | Admitting: Urology

## 2016-04-03 ENCOUNTER — Encounter (HOSPITAL_COMMUNITY)
Admission: RE | Admit: 2016-04-03 | Discharge: 2016-04-03 | Disposition: A | Discharge status: Home / Self Care | Payer: 59 | Source: Ambulatory Visit | Attending: Urology | Admitting: Urology

## 2016-04-03 ENCOUNTER — Encounter (HOSPITAL_COMMUNITY): Payer: Self-pay

## 2016-04-03 DIAGNOSIS — Z01812 Encounter for preprocedural laboratory examination: Secondary | ICD-10-CM | POA: Insufficient documentation

## 2016-04-03 HISTORY — DX: Other chest pain: R07.89

## 2016-04-03 HISTORY — DX: Unspecified asthma, uncomplicated: J45.909

## 2016-04-03 HISTORY — DX: Chronic kidney disease, unspecified: N18.9

## 2016-04-03 HISTORY — DX: Adverse effect of unspecified anesthetic, initial encounter: T41.45XA

## 2016-04-03 HISTORY — DX: Other complications of anesthesia, initial encounter: T88.59XA

## 2016-04-03 LAB — COMPREHENSIVE METABOLIC PANEL
ALBUMIN: 4.5 g/dL (ref 3.5–5.0)
ALT: 27 U/L (ref 17–63)
ANION GAP: 6 (ref 5–15)
AST: 24 U/L (ref 15–41)
Alkaline Phosphatase: 48 U/L (ref 38–126)
BILIRUBIN TOTAL: 1 mg/dL (ref 0.3–1.2)
BUN: 12 mg/dL (ref 6–20)
CO2: 27 mmol/L (ref 22–32)
Calcium: 9.2 mg/dL (ref 8.9–10.3)
Chloride: 104 mmol/L (ref 101–111)
Creatinine, Ser: 1.05 mg/dL (ref 0.61–1.24)
GFR calc Af Amer: >60 mL/min (ref >60)
GLUCOSE: 93 mg/dL (ref 65–99)
POTASSIUM: 4.3 mmol/L (ref 3.5–5.1)
Sodium: 137 mmol/L (ref 135–145)
TOTAL PROTEIN: 7.3 g/dL (ref 6.5–8.1)

## 2016-04-03 LAB — CBC
HEMATOCRIT: 43.4 % (ref 39.0–52.0)
Hemoglobin: 15.3 g/dL (ref 13.0–17.0)
MCH: 29.5 pg (ref 26.0–34.0)
MCHC: 35.3 g/dL (ref 30.0–36.0)
MCV: 83.6 fL (ref 78.0–100.0)
Platelets: 215 10*3/uL (ref 150–400)
RBC: 5.19 MIL/uL (ref 4.22–5.81)
RDW: 12.4 % (ref 11.5–15.5)
WBC: 7.9 10*3/uL (ref 4.0–10.5)

## 2016-04-03 LAB — ABO/RH: ABO/RH(D): A POS

## 2016-04-03 NOTE — Patient Instructions (Addendum)
TORAO CASPER  04/03/2016   Your procedure is scheduled on: 04/10/16  Report to [facility] Main  Entrance take [facility]  elevators to 3rd floor to  [employer] at 10:15  AM.  Call this number if you have problems the morning of surgery [phone]   Remember: ONLY 1 PERSON MAY GO WITH YOU TO SHORT STAY TO GET  READY MORNING OF [place].  Do not eat food or drink liquids :After Midnight.Tuesday     Take these medicines the morning of surgery with A SIP OF WATER: none                               You may not have any metal on your body including hair pins and              piercings  Do not wear jewelry, lotions, powders deodorant                         Men may shave face and neck.   Do not bring valuables to the hospital. [place].  Contacts, dentures or bridgework may not be worn into surgery.  Leave suitcase in the car. After surgery it may be brought to your room.              _____________________________________________________________________             [facility] - Preparing for Surgery Before surgery, you can play an important role.  Because skin is not sterile, your skin needs to be as free of germs as possible.  You can reduce the number of germs on your skin by washing with CHG (chlorahexidine gluconate) soap before surgery.  CHG is an antiseptic cleaner which kills germs and bonds with the skin to continue killing germs even after washing. Please DO NOT use if you have an allergy to CHG or antibacterial soaps.  If your skin becomes reddened/irritated stop using the CHG and inform your nurse when you arrive at Short Stay. Do not shave (including legs and underarms) for at least 48 hours prior to the first CHG shower.  You may shave your face/neck. Please follow these instructions carefully:  1.  Shower with CHG Soap the night before surgery and the  morning of Surgery.  2.  If you  choose to wash your hair, wash your hair first as usual with your  normal  shampoo.  3.  After you shampoo, rinse your hair and body thoroughly to remove the  shampoo.                           4.  Use CHG as you would any other liquid soap.  You can apply chg directly  to the skin and wash                       Gently with a scrungie or clean washcloth.  5.  Apply the CHG Soap to your body ONLY FROM THE NECK DOWN.   Do not use on face/ open  Wound or open sores. Avoid contact with eyes, ears mouth and genitals (private parts).                       Wash face,  Genitals (private parts) with your normal soap.             6.  Wash thoroughly, paying special attention to the area where your surgery  will be performed.  7.  Thoroughly rinse your body with warm water from the neck down.  8.  DO NOT shower/wash with your normal soap after using and rinsing off  the CHG Soap.                9.  Pat yourself dry with a clean towel.            10.  Wear clean pajamas.            11.  Place clean sheets on your bed the night of your first shower and do not  sleep with pets. Day of Surgery : Do not apply any lotions/deodorants the morning of surgery.  Please wear clean clothes to the hospital/surgery center.  FAILURE TO FOLLOW THESE INSTRUCTIONS MAY RESULT IN THE CANCELLATION OF YOUR SURGERY PATIENT SIGNATURE_________________________________  NURSE SIGNATURE__________________________________  ________________________________________________________________________

## 2016-04-04 LAB — URINE CULTURE: Culture: 2000 — AB

## 2016-04-09 MED ORDER — GENTAMICIN SULFATE 40 MG/ML IJ SOLN
5.0000 mg/kg | INTRAVENOUS | Status: AC
  Administered 2016-04-10: 410 mg via INTRAVENOUS
  Filled 2016-04-09: qty 11

## 2016-04-10 ENCOUNTER — Inpatient Hospital Stay (HOSPITAL_COMMUNITY): Payer: 59 | Admitting: Certified Registered Nurse Anesthetist

## 2016-04-10 ENCOUNTER — Ambulatory Visit: Payer: Self-pay | Admitting: Urology

## 2016-04-10 ENCOUNTER — Encounter: Payer: Self-pay | Admitting: Urology

## 2016-04-10 ENCOUNTER — Encounter (HOSPITAL_COMMUNITY): Payer: Self-pay | Admitting: *Deleted

## 2016-04-10 ENCOUNTER — Encounter (HOSPITAL_COMMUNITY)
Admission: RE | Disposition: A | Discharge status: Home / Self Care | Payer: Self-pay | Source: Ambulatory Visit | Attending: Urology

## 2016-04-10 ENCOUNTER — Inpatient Hospital Stay (HOSPITAL_COMMUNITY)
Admission: RE | Admit: 2016-04-10 | Discharge: 2016-04-11 | DRG: 658 | Disposition: A | Discharge status: Home / Self Care | Payer: 59 | Source: Ambulatory Visit | Attending: Urology | Admitting: Urology

## 2016-04-10 DIAGNOSIS — Z6832 Body mass index (BMI) 32.0-32.9, adult: Secondary | ICD-10-CM | POA: Diagnosis not present

## 2016-04-10 DIAGNOSIS — N2889 Other specified disorders of kidney and ureter: Secondary | ICD-10-CM | POA: Diagnosis present

## 2016-04-10 DIAGNOSIS — C642 Malignant neoplasm of left kidney, except renal pelvis: Secondary | ICD-10-CM | POA: Diagnosis present

## 2016-04-10 DIAGNOSIS — Z87891 Personal history of nicotine dependence: Secondary | ICD-10-CM

## 2016-04-10 DIAGNOSIS — K219 Gastro-esophageal reflux disease without esophagitis: Secondary | ICD-10-CM | POA: Diagnosis present

## 2016-04-10 HISTORY — PX: ROBOTIC ASSITED PARTIAL NEPHRECTOMY: SHX6087

## 2016-04-10 LAB — HEMOGLOBIN AND HEMATOCRIT, BLOOD
HEMATOCRIT: 42.9 % (ref 39.0–52.0)
HEMOGLOBIN: 14.9 g/dL (ref 13.0–17.0)

## 2016-04-10 LAB — TYPE AND SCREEN
ABO/RH(D): A POS
ANTIBODY SCREEN: NEGATIVE

## 2016-04-10 SURGERY — NEPHRECTOMY, PARTIAL, ROBOT-ASSISTED
Anesthesia: General | Laterality: Left

## 2016-04-10 MED ORDER — STERILE WATER FOR IRRIGATION IR SOLN
Status: DC | PRN
  Administered 2016-04-10: 1000 mL

## 2016-04-10 MED ORDER — CLINDAMYCIN PHOSPHATE 900 MG/50ML IV SOLN
900.0000 mg | INTRAVENOUS | Status: AC
  Administered 2016-04-10: 900 mg via INTRAVENOUS

## 2016-04-10 MED ORDER — HYDROCODONE-ACETAMINOPHEN 5-325 MG PO TABS: 1.0000 | ORAL_TABLET | Freq: Four times a day (QID) | ORAL | Status: DC | PRN

## 2016-04-10 MED ORDER — FENTANYL CITRATE (PF) 250 MCG/5ML IJ SOLN
INTRAMUSCULAR | Status: AC
Start: 2016-04-10 — End: 2016-04-10
  Filled 2016-04-10: qty 5

## 2016-04-10 MED ORDER — OXYCODONE HCL 5 MG PO TABS
5.0000 mg | ORAL_TABLET | ORAL | Status: DC | PRN
  Filled 2016-04-10 (×2): qty 1

## 2016-04-10 MED ORDER — HYDROMORPHONE HCL 1 MG/ML IJ SOLN
INTRAMUSCULAR | Status: AC
  Filled 2016-04-10: qty 1

## 2016-04-10 MED ORDER — SUGAMMADEX SODIUM 500 MG/5ML IV SOLN
INTRAVENOUS | Status: AC
Start: 2016-04-10 — End: 2016-04-10
  Filled 2016-04-10: qty 5

## 2016-04-10 MED ORDER — DEXAMETHASONE SODIUM PHOSPHATE 10 MG/ML IJ SOLN
INTRAMUSCULAR | Status: DC | PRN
  Administered 2016-04-10: 10 mg via INTRAVENOUS

## 2016-04-10 MED ORDER — LACTATED RINGERS IV SOLN
INTRAVENOUS | Status: DC
  Administered 2016-04-10 (×3): via INTRAVENOUS

## 2016-04-10 MED ORDER — HYDROMORPHONE HCL 2 MG/ML IJ SOLN
INTRAMUSCULAR | Status: AC
  Filled 2016-04-10: qty 1

## 2016-04-10 MED ORDER — CLINDAMYCIN PHOSPHATE 900 MG/50ML IV SOLN
INTRAVENOUS | Status: AC
  Filled 2016-04-10: qty 50

## 2016-04-10 MED ORDER — PROPOFOL 10 MG/ML IV BOLUS
INTRAVENOUS | Status: AC
  Filled 2016-04-10: qty 20

## 2016-04-10 MED ORDER — MIDAZOLAM HCL 5 MG/5ML IJ SOLN
INTRAMUSCULAR | Status: DC | PRN
  Administered 2016-04-10: 2 mg via INTRAVENOUS

## 2016-04-10 MED ORDER — LACTATED RINGERS IR SOLN
Status: DC | PRN
Start: 2016-04-10 — End: 2016-04-10
  Administered 2016-04-10: 1000 mL

## 2016-04-10 MED ORDER — ONDANSETRON HCL 4 MG/2ML IJ SOLN
INTRAMUSCULAR | Status: AC
  Filled 2016-04-10: qty 2

## 2016-04-10 MED ORDER — FENTANYL CITRATE (PF) 100 MCG/2ML IJ SOLN
INTRAMUSCULAR | Status: DC | PRN
  Administered 2016-04-10: 50 ug via INTRAVENOUS
  Administered 2016-04-10: 100 ug via INTRAVENOUS
  Administered 2016-04-10 (×4): 50 ug via INTRAVENOUS

## 2016-04-10 MED ORDER — LIDOCAINE HCL (CARDIAC) 20 MG/ML IV SOLN
INTRAVENOUS | Status: DC | PRN
  Administered 2016-04-10: 100 mg via INTRAVENOUS

## 2016-04-10 MED ORDER — LIDOCAINE HCL (CARDIAC) 20 MG/ML IV SOLN
INTRAVENOUS | Status: AC
  Filled 2016-04-10: qty 5

## 2016-04-10 MED ORDER — SODIUM CHLORIDE 0.9 % IJ SOLN
INTRAMUSCULAR | Status: DC | PRN
  Administered 2016-04-10: 20 mL

## 2016-04-10 MED ORDER — SODIUM CHLORIDE 0.9 % IJ SOLN
INTRAMUSCULAR | Status: AC
  Filled 2016-04-10: qty 10

## 2016-04-10 MED ORDER — HYDROMORPHONE HCL 1 MG/ML IJ SOLN
0.2500 mg | INTRAMUSCULAR | Status: DC | PRN
  Administered 2016-04-10 (×4): 0.5 mg via INTRAVENOUS

## 2016-04-10 MED ORDER — DEXAMETHASONE SODIUM PHOSPHATE 10 MG/ML IJ SOLN
INTRAMUSCULAR | Status: AC
  Filled 2016-04-10: qty 1

## 2016-04-10 MED ORDER — BUPIVACAINE-EPINEPHRINE 0.25% -1:200000 IJ SOLN
INTRAMUSCULAR | Status: AC
  Filled 2016-04-10: qty 1

## 2016-04-10 MED ORDER — MANNITOL 25 % IV SOLN
25.0000 g | Freq: Once | INTRAVENOUS | Status: AC
  Administered 2016-04-10 (×2): 12.5 g via INTRAVENOUS
  Filled 2016-04-10: qty 100

## 2016-04-10 MED ORDER — LABETALOL HCL 5 MG/ML IV SOLN
INTRAVENOUS | Status: DC | PRN
  Administered 2016-04-10: 2.5 mg via INTRAVENOUS

## 2016-04-10 MED ORDER — LABETALOL HCL 5 MG/ML IV SOLN
INTRAVENOUS | Status: AC
  Filled 2016-04-10: qty 4

## 2016-04-10 MED ORDER — SUCCINYLCHOLINE CHLORIDE 20 MG/ML IJ SOLN
INTRAMUSCULAR | Status: DC | PRN
  Administered 2016-04-10: 100 mg via INTRAVENOUS

## 2016-04-10 MED ORDER — ONDANSETRON HCL 4 MG/2ML IJ SOLN
INTRAMUSCULAR | Status: DC | PRN
  Administered 2016-04-10: 4 mg via INTRAVENOUS

## 2016-04-10 MED ORDER — DIPHENHYDRAMINE HCL 12.5 MG/5ML PO ELIX: 12.5000 mg | ORAL_SOLUTION | Freq: Four times a day (QID) | ORAL | Status: DC | PRN

## 2016-04-10 MED ORDER — BUPIVACAINE HCL 0.25 % IJ SOLN
INTRAMUSCULAR | Status: DC | PRN
  Administered 2016-04-10: 12 mL

## 2016-04-10 MED ORDER — ROCURONIUM BROMIDE 100 MG/10ML IV SOLN
INTRAVENOUS | Status: DC | PRN
  Administered 2016-04-10: 50 mg via INTRAVENOUS
  Administered 2016-04-10 (×2): 10 mg via INTRAVENOUS
  Administered 2016-04-10 (×2): 20 mg via INTRAVENOUS

## 2016-04-10 MED ORDER — PROMETHAZINE HCL 25 MG/ML IJ SOLN: 6.2500 mg | INTRAMUSCULAR | Status: DC | PRN

## 2016-04-10 MED ORDER — ACETAMINOPHEN 10 MG/ML IV SOLN
1000.0000 mg | Freq: Four times a day (QID) | INTRAVENOUS | Status: AC
  Administered 2016-04-10 – 2016-04-11 (×4): 1000 mg via INTRAVENOUS
  Filled 2016-04-10 (×4): qty 100

## 2016-04-10 MED ORDER — MEPERIDINE HCL 50 MG/ML IJ SOLN: 6.2500 mg | INTRAMUSCULAR | Status: DC | PRN

## 2016-04-10 MED ORDER — SUGAMMADEX SODIUM 200 MG/2ML IV SOLN
INTRAVENOUS | Status: DC | PRN
  Administered 2016-04-10: 200 mg via INTRAVENOUS

## 2016-04-10 MED ORDER — ONDANSETRON HCL 4 MG/2ML IJ SOLN
4.0000 mg | INTRAMUSCULAR | Status: DC | PRN
  Administered 2016-04-10: 4 mg via INTRAVENOUS
  Filled 2016-04-10: qty 2

## 2016-04-10 MED ORDER — MIDAZOLAM HCL 2 MG/2ML IJ SOLN
INTRAMUSCULAR | Status: AC
  Filled 2016-04-10: qty 2

## 2016-04-10 MED ORDER — SODIUM CHLORIDE 0.9 % IJ SOLN
INTRAMUSCULAR | Status: AC
  Filled 2016-04-10: qty 50

## 2016-04-10 MED ORDER — SUGAMMADEX SODIUM 200 MG/2ML IV SOLN
INTRAVENOUS | Status: AC
  Filled 2016-04-10: qty 2

## 2016-04-10 MED ORDER — HYDROMORPHONE HCL 1 MG/ML IJ SOLN
INTRAMUSCULAR | Status: DC | PRN
  Administered 2016-04-10 (×2): .4 mg via INTRAVENOUS
  Administered 2016-04-10: .2 mg via INTRAVENOUS
  Administered 2016-04-10: .4 mg via INTRAVENOUS
  Administered 2016-04-10: .2 mg via INTRAVENOUS
  Administered 2016-04-10: .4 mg via INTRAVENOUS

## 2016-04-10 MED ORDER — ROCURONIUM BROMIDE 100 MG/10ML IV SOLN
INTRAVENOUS | Status: AC
  Filled 2016-04-10: qty 1

## 2016-04-10 MED ORDER — HYDROMORPHONE HCL 1 MG/ML IJ SOLN
0.5000 mg | INTRAMUSCULAR | Status: DC | PRN
  Administered 2016-04-10 – 2016-04-11 (×4): 1 mg via INTRAVENOUS
  Filled 2016-04-10 (×4): qty 1

## 2016-04-10 MED ORDER — PROPOFOL 10 MG/ML IV BOLUS
INTRAVENOUS | Status: DC | PRN
  Administered 2016-04-10: 160 mg via INTRAVENOUS

## 2016-04-10 MED ORDER — DEXTROSE-NACL 5-0.45 % IV SOLN
INTRAVENOUS | Status: DC
  Administered 2016-04-10 – 2016-04-11 (×2): via INTRAVENOUS

## 2016-04-10 MED ORDER — DIPHENHYDRAMINE HCL 50 MG/ML IJ SOLN
12.5000 mg | Freq: Four times a day (QID) | INTRAMUSCULAR | Status: DC | PRN
  Administered 2016-04-10: 25 mg via INTRAVENOUS
  Filled 2016-04-10: qty 1

## 2016-04-10 MED ORDER — MIDAZOLAM HCL 2 MG/2ML IJ SOLN: 0.5000 mg | Freq: Once | INTRAMUSCULAR | Status: DC | PRN

## 2016-04-10 MED ORDER — BUPIVACAINE LIPOSOME 1.3 % IJ SUSP
20.0000 mL | Freq: Once | INTRAMUSCULAR | Status: AC
  Administered 2016-04-10: 20 mL
  Filled 2016-04-10: qty 20

## 2016-04-10 SURGICAL SUPPLY — 60 items
APPLICATOR ARISTA FLEXITIP XL (MISCELLANEOUS) IMPLANT
APPLICATOR SURGIFLO ENDO (HEMOSTASIS) ×2 IMPLANT
BAG RETRIEVAL 10 (BASKET) ×1
CHLORAPREP W/TINT 26ML (MISCELLANEOUS) ×2 IMPLANT
CLIP LIGATING HEM O LOK PURPLE (MISCELLANEOUS) ×2 IMPLANT
CLIP LIGATING HEMO LOK XL GOLD (MISCELLANEOUS) IMPLANT
CLIP LIGATING HEMO O LOK GREEN (MISCELLANEOUS) ×4 IMPLANT
COVER SURGICAL LIGHT HANDLE (MISCELLANEOUS) IMPLANT
COVER TIP SHEARS 8 DVNC (MISCELLANEOUS) ×1 IMPLANT
COVER TIP SHEARS 8MM DA VINCI (MISCELLANEOUS) ×1
DECANTER SPIKE VIAL GLASS SM (MISCELLANEOUS) ×2 IMPLANT
DRAIN CHANNEL 15F RND FF 3/16 (WOUND CARE) ×2 IMPLANT
DRAPE ARM DVNC X/XI (DISPOSABLE) ×4 IMPLANT
DRAPE COLUMN DVNC XI (DISPOSABLE) ×1 IMPLANT
DRAPE DA VINCI XI ARM (DISPOSABLE) ×4
DRAPE DA VINCI XI COLUMN (DISPOSABLE) ×1
DRAPE INCISE IOBAN 66X45 STRL (DRAPES) ×2 IMPLANT
DRAPE LAPAROSCOPIC ABDOMINAL (DRAPES) ×2 IMPLANT
DRAPE SHEET LG 3/4 BI-LAMINATE (DRAPES) ×2 IMPLANT
DRSG TEGADERM 4X4.75 (GAUZE/BANDAGES/DRESSINGS) ×2 IMPLANT
ELECT PENCIL ROCKER SW 15FT (MISCELLANEOUS) ×2 IMPLANT
ELECT REM PT RETURN 9FT ADLT (ELECTROSURGICAL) ×2
ELECTRODE REM PT RTRN 9FT ADLT (ELECTROSURGICAL) ×1 IMPLANT
EVACUATOR SILICONE 100CC (DRAIN) ×2 IMPLANT
GAUZE SPONGE 2X2 8PLY STRL LF (GAUZE/BANDAGES/DRESSINGS) ×1 IMPLANT
GLOVE BIO SURGEON STRL SZ 6.5 (GLOVE) ×2 IMPLANT
GLOVE BIOGEL M STRL SZ7.5 (GLOVE) ×4 IMPLANT
GOWN STRL REUS W/TWL LRG LVL3 (GOWN DISPOSABLE) ×6 IMPLANT
HEMOSTAT ARISTA ABSORB 3G PWDR (MISCELLANEOUS) IMPLANT
KIT BASIN OR (CUSTOM PROCEDURE TRAY) ×2 IMPLANT
LIQUID BAND (GAUZE/BANDAGES/DRESSINGS) ×2 IMPLANT
LOOP VESSEL MAXI BLUE (MISCELLANEOUS) IMPLANT
MARKER SKIN DUAL TIP RULER LAB (MISCELLANEOUS) ×2 IMPLANT
NS IRRIG 1000ML POUR BTL (IV SOLUTION) IMPLANT
POSITIONER SURGICAL ARM (MISCELLANEOUS) ×4 IMPLANT
POUCH SPECIMEN RETRIEVAL 10MM (ENDOMECHANICALS) IMPLANT
SEAL CANN UNIV 5-8 DVNC XI (MISCELLANEOUS) ×4 IMPLANT
SEAL XI 5MM-8MM UNIVERSAL (MISCELLANEOUS) ×4
SET TUBE IRRIG SUCTION NO TIP (IRRIGATION / IRRIGATOR) ×2 IMPLANT
SOLUTION ELECTROLUBE (MISCELLANEOUS) ×2 IMPLANT
SPONGE GAUZE 2X2 STER 10/PKG (GAUZE/BANDAGES/DRESSINGS) ×1
SURGIFLO W/THROMBIN 8M KIT (HEMOSTASIS) ×2 IMPLANT
SUT ETHILON 3 0 PS 1 (SUTURE) ×2 IMPLANT
SUT MNCRL AB 4-0 PS2 18 (SUTURE) ×4 IMPLANT
SUT V-LOC BARB 180 2/0GR6 GS22 (SUTURE) ×2
SUT VICRYL 0 UR6 27IN ABS (SUTURE) ×2 IMPLANT
SUT VLOC BARB 180 ABS3/0GR12 (SUTURE) ×2
SUTURE V-LC BRB 180 2/0GR6GS22 (SUTURE) ×1 IMPLANT
SUTURE VLOC BRB 180 ABS3/0GR12 (SUTURE) ×1 IMPLANT
SYS BAG RETRIEVAL 10MM (BASKET) ×1
SYSTEM BAG RETRIEVAL 10MM (BASKET) ×1 IMPLANT
TAPE STRIPS DRAPE STRL (GAUZE/BANDAGES/DRESSINGS) ×2 IMPLANT
TOWEL OR 17X26 10 PK STRL BLUE (TOWEL DISPOSABLE) ×2 IMPLANT
TOWEL OR NON WOVEN STRL DISP B (DISPOSABLE) ×2 IMPLANT
TRAY FOLEY W/METER SILVER 14FR (SET/KITS/TRAYS/PACK) IMPLANT
TRAY FOLEY W/METER SILVER 16FR (SET/KITS/TRAYS/PACK) ×2 IMPLANT
TRAY LAPAROSCOPIC (CUSTOM PROCEDURE TRAY) ×2 IMPLANT
TROCAR XCEL 12X100 BLDLESS (ENDOMECHANICALS) ×2 IMPLANT
WATER STERILE IRR 1000ML POUR (IV SOLUTION) ×2 IMPLANT
WATER STERILE IRR 1500ML POUR (IV SOLUTION) IMPLANT

## 2016-04-10 NOTE — Anesthesia Preprocedure Evaluation (Addendum)
Anesthesia Evaluation  Patient identified by MRN, date of birth, ID band Patient awake    Reviewed: Allergy & Precautions, NPO status , Patient's Chart, lab work & pertinent test results  History of Anesthesia Complications Negative for: history of anesthetic complications  Airway Mallampati: II  TM Distance: >3 FB Neck ROM: Full    Dental  (+) Dental Advisory Given   Pulmonary asthma , former smoker (quit 2012),    breath sounds clear to auscultation       Cardiovascular + angina negative cardio ROS   Rhythm:Regular Rate:Normal     Neuro/Psych Back pain    GI/Hepatic negative GI ROS, Neg liver ROS,   Endo/Other  Morbid obesity  Renal/GU Renal mass     Musculoskeletal   Abdominal   Peds  Hematology negative hematology ROS (+)   Anesthesia Other Findings   Reproductive/Obstetrics                            Anesthesia Physical Anesthesia Plan  ASA: II  Anesthesia Plan: General   Post-op Pain Management:    Induction: Intravenous  Airway Management Planned: Oral ETT  Additional Equipment:   Intra-op Plan:   Post-operative Plan: Extubation in OR  Informed Consent: I have reviewed the patients History and Physical, chart, labs and discussed the procedure including the risks, benefits and alternatives for the proposed anesthesia with the patient or authorized representative who has indicated his/her understanding and acceptance.   Dental advisory given  Plan Discussed with: CRNA and Surgeon  Anesthesia Plan Comments: (Plan routine monitors, GETA)        Anesthesia Quick Evaluation

## 2016-04-10 NOTE — Transfer of Care (Signed)
Immediate Anesthesia Transfer of Care Note  Patient: Michael Rubio  Procedure(s) Performed: Procedure(s) with comments: XI ROBOTIC ASSITED LEFT  PARTIAL NEPHRECTOMY (Left) - Clamp on: 1408 Clamp off: 1420 Total clamp time: 12 minutes  Patient Location: PACU  Anesthesia Type:General  Level of Consciousness:  sedated, patient cooperative and responds to stimulation  Airway & Oxygen Therapy:Patient Spontanous Breathing and Patient connected to face mask oxgen  Post-op Assessment:  Report given to PACU RN and Post -op Vital signs reviewed and stable  Post vital signs:  Reviewed and stable  Last Vitals:  Filed Vitals:   04/10/16 1000  BP: 124/75  Pulse: 60  Temp: 37.1 C  Resp: 18    Complications: No apparent anesthesia complications

## 2016-04-10 NOTE — Anesthesia Postprocedure Evaluation (Signed)
Anesthesia Post Note  Patient: Izeyah Peal [place]  Procedure(s) Performed: Procedure(s) (LRB): XI ROBOTIC ASSITED LEFT  PARTIAL NEPHRECTOMY (Left)  Patient location during evaluation: PACU Anesthesia Type: General Level of consciousness: awake and alert, oriented and patient cooperative Pain management: pain level controlled Vital Signs Assessment: post-procedure vital signs reviewed and stable Respiratory status: spontaneous breathing, nonlabored ventilation, respiratory function stable and patient connected to nasal cannula oxygen Cardiovascular status: blood pressure returned to baseline and stable Postop Assessment: no signs of nausea or vomiting Anesthetic complications: no    Last Vitals:  Filed Vitals:   04/10/16 1630 04/10/16 1645  BP: 134/91 133/87  Pulse: 79 78  Temp: 36.4 C 37 C  Resp: 19 14    Last Pain:  Filed Vitals:   04/10/16 1958  PainSc: 4                  Kira Hartl,E. Darlean Warmoth

## 2016-04-10 NOTE — H&P (Signed)
HPI: Michael Rubio is a 35 year-old male patient who was referred by Dr. Gwen Her. Dianah Field, MD who is here further eval and management of a renal mass..  The problem is on the left side. His problem was diagnosed 03/12/2016. He had the following x-rays done: MRI Scan.   He has had no symptoms.   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 Seen for abdominal pain work-up. Abdominal pain has improved.   No abdominal surgeries.   Works as a Educational psychologist, recent 62mo baby born. Considering vasectomy as well.     ALLERGIES: Ciprofloxacin Penicillin    MEDICATIONS: Phentermine Hcl 1 PO Daily     GU PSH: None   NON-GU PSH: Wrist Arthroscopy/surgery    GU PMH: None   NON-GU PMH: Gastro-esophageal reflux disease without esophagitis    FAMILY HISTORY: Family Health Status Children is 1 daughter and 1 - No Family History Father deceased - Father Kidney Stones - Father   SOCIAL HISTORY: Marital Status: Married Current Smoking Status: Patient does not smoke anymore. Has not smoked since 02/22/2008. Smoked for 8 years.  Drinks 1 drink per day.  Drinks 3 caffeinated drinks per day. Patient's occupation Electrical engineer.    REVIEW OF SYSTEMS:    GU Review Male:   Patient denies frequent urination, hard to postpone urination, burning/ pain with urination, get up at night to urinate, leakage of urine, stream starts and stops, trouble starting your stream, have to strain to urinate , erection problems, and penile pain.  Gastrointestinal (Upper):   Patient denies nausea, vomiting, and indigestion/ heartburn.  Gastrointestinal (Lower):   Patient denies diarrhea and constipation.  Constitutional:   Patient reports fatigue. Patient denies fever, night sweats, and weight loss.  Skin:   Patient denies skin rash/ lesion and itching.  Eyes:   Patient reports blurred vision. Patient denies double vision.  Ears/ Nose/ Throat:   Patient denies sore throat and sinus problems.   Hematologic/Lymphatic:   Patient denies swollen glands and easy bruising.  Cardiovascular:   Patient denies leg swelling and chest pains.  Respiratory:   Patient denies cough and shortness of breath.  Endocrine:   Patient denies excessive thirst.  Musculoskeletal:   Patient denies joint pain and back pain.  Neurological:   Patient denies headaches and dizziness.  Psychologic:   Patient denies depression and anxiety.   VITAL SIGNS:      03/22/2016 03:42 PM  Weight 230 lb / 104.33 kg  Height 71 in / 180.34 cm  Heart Rate 91 /min  Temperature 98.2 F / 37 C  BMI 32.1 kg/m   MULTI-SYSTEM PHYSICAL EXAMINATION:    Constitutional: Well-nourished. No physical deformities. Normally developed. Good grooming.  Neck: Neck symmetrical, not swollen. Normal tracheal position.  Respiratory: No labored breathing, no use of accessory muscles.   Cardiovascular: Normal temperature, normal extremity pulses, no swelling, no varicosities.  Lymphatic: No enlargement of neck, axillae, groin.  Skin: No paleness, no jaundice, no cyanosis. No lesion, no ulcer, no rash.  Neurologic / Psychiatric: Oriented to time, oriented to place, oriented to person. No depression, no anxiety, no agitation.  Gastrointestinal: No mass, no tenderness, no rigidity, non obese abdomen.  Eyes: Normal conjunctivae. Normal eyelids.  Ears, Nose, Mouth, and Throat: Left ear no scars, no lesions, no masses. Right ear no scars, no lesions, no masses. Nose no scars, no lesions, no masses. Normal hearing. Normal lips.  Musculoskeletal: Normal gait and station of head and neck.  PAST DATA REVIEWED:  Source Of History:  Patient  Records Review:   Previous Doctor Records, Previous Patient Records  Urine Test Review:   Urinalysis  X-Ray Review: C.T. Abdomen/Pelvis: Reviewed Films.  MRI Abdomen: Reviewed Films. Reviewed Report. Discussed With Patient.     PROCEDURES:          Urinalysis - 81003 Dipstick Dipstick Cont'd  Specimen:  Voided Bilirubin: Neg  Color: Yellow Ketones: Neg  Appearance: Clear Blood: Neg  Specific Gravity: 1.025 Protein: Neg  pH: 5.5 Urobilinogen: 0.2  Glucose: Neg Nitrites: Neg    Leukocyte Esterase: Neg    ASSESSMENT:      ICD-10 Details  1 GU:   Benign Neo Kidney, Unspec - D30.00    PLAN:           Schedule Return Visit: ASAP - Schedule Surgery          Document Letter(s):  Created for Patient: Clinical Summary    The patient has been given the natural history of renal cancer, treatment options, and I recommended surgical extirpation for this patient. I went over the robotic-assisted laparoscopic partial nephrectomy approach. I described for the patient the procedure in detail including port placement. I detailed the postoperative course including the fact that the patient would have both a drain and a Foley catheter following the surgery. I told the patient that most often patients are discharged on postoperative day one or 2. I then detailed the expected recovery time, I told the patient that he would not be able to lift anything greater than 20 pounds for 4 weeks. I also went over the risks and benefits of this operation in great detail. We discussed the risk of injury to surrounding structures, major blood vessels and nerves, bleeding, infection, loss of kidney, and the risk of recurrent cancer.         Notes:   Bosniak 3 cyst - recommended treatment given age and size of cyst. Simple hilum. Patient would like to proceed with surgery.

## 2016-04-10 NOTE — Op Note (Signed)
Preoperative diagnosis: Left renal mass  Postoperative diagnosis: Left renal mass  Procedure:  1. Left robotic-assisted laparoscopic partial nephrectomy  Surgeon: Louis Meckel, MD 1st Assistant: Debbrah Alar, PA Resident surgeon: Virginia Crews, MD  Anesthesia: General  Complications: None  EBL: 25 mL  IVF:  See anesthesia record  Specimens: 1. Left renal mass  Disposition of specimens: Pathology  Intraoperative findings:       1. Warm renal ischemia time: 12 minutes  Drains: 1. # 19 Blake perinephric drain  Indication:  Michael Rubio is a 35 y.o. year old patient with a left renal mass.  After a thorough review of the management options for their renal mass, they elected to proceed with surgical treatment and the above procedure.  We have discussed the potential benefits and risks of the procedure, side effects of the proposed treatment, the likelihood of the patient achieving the goals of the procedure, and any potential problems that might occur during the procedure or recuperation. Informed consent has been obtained.   Description of procedure:  The patient was taken to the operating room and a general anesthetic was administered. A 16Fr foley catheter was placed. The patient was given preoperative antibiotics, placed in the left modified flank position with care to pad all potential pressure points, and prepped and draped in the usual sterile fashion. Next a preoperative timeout was performed.  A site was selected on the left side of the umbilicus for placement of the camera port. This was placed using a standard open Hassan technique which allowed entry into the peritoneal cavity under direct vision and without difficulty. An 8 mm port was placed and a pneumoperitoneum established. The camera was then used to inspect the abdomen and there was no evidence of any intra-abdominal injuries or other abnormalities. The remaining abdominal ports were then placed. 8 mm  robotic ports were placed in the left upper quadrant, left lower quadrant, and far left lateral abdominal wall. A 12 mm port was placed in the upper midline for laparoscopic assistance. All ports were placed under direct vision without difficulty. The surgical cart was then docked.   Utilizing the cautery scissors, the white line of Toldt was incised allowing the colon to be mobilized medially and the plane between the mesocolon and the anterior layer of Gerota's fascia to be developed and the kidney to be exposed.  The ureter and gonadal vein were identified inferiorly and the ureter was lifted anteriorly off the psoas muscle.  Dissection proceeded superiorly along the gonadal vein until the renal vein was identified.  The renal hilum was then carefully isolated with a combination of blunt and sharp dissection allowing the renal arterial and venous structures to be separated and isolated in preparation for renal hilar vessel clamping.  12.5 g of IV mannitol was then administered.   Attention turned to the kidney and the perinephric fat surrounding the renal mass was removed and the kidney was mobilized sufficiently for exposure and resection of the renal mass.   Once the renal mass was properly isolated, preparations were made for resection of the tumor.  The borders of the tumor were marked with cautery. The left renal artery was then clamped with bulldog clamps.  The tumor was then excised with cold scissor dissection along with an adequate visible gross margin of normal renal parenchyma. The tumor appeared to be excised without any gross violation of the tumor. The renal collecting system was entered slightly during removal of the tumor.  A running 3-0  V-lock suture was then brought through the capsule of the kidney and run along the base of the renal defect to provide hemostasis and close any entry into the renal collecting system if present. A running 2-0 V lock suture was then used to close the renal  capsule using a sliding clip technique which resulted in excellent compression of the renal defect. Weck clips were used to secure this suture outside the renal capsule at the proximal and distal ends. An additional hemostatic agent (Surgiflo) was then placed into the renal defect and around the hilum.    The bulldog clamps were then removed from the renal artery and an additional 12.5 g of IV mannitol was administered. Total warm renal ischemia time was 12 minutes. The renal tumor resection site was examined. Hemostasis appeared adequate.   The kidney was placed back into its normal anatomic position and covered with Gerotas and and perinephric fat.  A # 16 Blake drain was then brought through the lateral lower port site and positioned in the perinephric space.  It was secured to the skin with a nylon suture. The surgical cart was undocked.  The renal tumor specimen was removed intact within an endopouch retrieval bag via the midline assistant site.  This site was then closed at the fascial level with 0-vicryl suture.  All other laparoscopic/robotic ports were removed under direct vision and the pneumoperitoneum let down with inspection of the operative field performed and hemostasis again confirmed. All incision sites were then injected with local anesthetic and reapproximated at the skin level with 4-0 monocryl subcuticular closures.  Dermabond was applied to the skin.  The patient tolerated the procedure well and without complications.  The patient was able to be extubated and transferred to the recovery unit in satisfactory condition.

## 2016-04-10 NOTE — Discharge Summary (Signed)
Date of admission: 04/10/2016  Date of discharge: 04/11/2016  Admission diagnosis: left renal mass  Discharge diagnosis: left renal mass  Secondary diagnoses: none  History and Physical: For full details, please see admission history and physical. Briefly, ARGEL CARTEE is a 35 y.o. year old patient with a left renal mass.   Hospital Course: 35 yo male who is s/p left robotic partial nephrectomy with Dr. Louis Meckel on 04/10/16. He did well post-operatively. His diet was slowly advanced and at the time of discharge he was tolerating a regular diet, ambulating at his baseline, was voiding spontaneously after foley catheter removal, and pain was well controlled with oral narcotics. His JP drain was removed on POD#1 due to minimal output and good UOP following foley removal. He was discharged to home on POD#1.  Laboratory values:   Recent Labs  04/10/16 1551 04/11/16 0434  HGB 14.9 14.0  HCT 42.9 40.0    Recent Labs  04/11/16 0434  CREATININE 0.97    Disposition: Home  Discharge instruction:      Discharge Instructions    Discharge instructions    Complete by:  As directed   Activity:  You are encouraged to ambulate frequently (about every hour during waking hours) to help prevent blood clots from forming in your legs or lungs.  However, you should not engage in any heavy lifting (> 10-15 lbs), strenuous activity, or straining. Diet: You should advance your diet as instructed by your physician.  It will be normal to have some bloating, nausea, and abdominal discomfort intermittently. Prescriptions:  You will be provided a prescription for pain medication to take as needed.  If your pain is not severe enough to require the prescription pain medication, you may take extra strength Tylenol instead which will have less side effects.  You should also take a prescribed stool softener to avoid straining with bowel movements as the prescription pain medication may constipate  you. Incisions: You may remove your dressing bandages 48 hours after surgery if not removed in the hospital.  You will either have some small staples or special tissue glue at each of the incision sites. Once the bandages are removed (if present), the incisions may stay open to air.  You may start showering (but not soaking or bathing in water) the 2nd day after surgery and the incisions simply need to be patted dry after the shower.  No additional care is needed. What to call us about: You should call the office [phone]) if you develop fever > 101 or develop persistent vomiting.           Discharge medications:    Medication List    STOP taking these medications        phentermine 37.5 MG tablet  Commonly known as:  ADIPEX-P      TAKE these medications        HYDROcodone-acetaminophen 5-325 MG tablet  Commonly known as:  NORCO  Take 1-2 tablets by mouth every 6 (six) hours as needed for moderate pain.        Followup:  Follow-up Information    Follow up with Ardis Hughs, MD.   Specialty:  Urology   Why:  call office for date and time of appointment   Contact information:   [place] [address] [phone]

## 2016-04-10 NOTE — Discharge Instructions (Signed)

## 2016-04-10 NOTE — Anesthesia Procedure Notes (Signed)
Procedure Name: Intubation Date/Time: 04/10/2016 11:55 AM Performed by: West Pugh Pre-anesthesia Checklist: Patient identified, Emergency Drugs available, Suction available, Patient being monitored and Timeout performed Patient Re-evaluated:Patient Re-evaluated prior to inductionOxygen Delivery Method: Circle system utilized Preoxygenation: Pre-oxygenation with 100% oxygen Intubation Type: IV induction Ventilation: Mask ventilation without difficulty Laryngoscope Size: Mac and 4 Grade View: Grade II Tube type: Oral Tube size: 7.5 mm Number of attempts: 1 Airway Equipment and Method: Stylet Placement Confirmation: ETT inserted through vocal cords under direct vision,  positive ETCO2,  CO2 detector and breath sounds checked- equal and bilateral Secured at: 22 cm Tube secured with: Tape Dental Injury: Teeth and Oropharynx as per pre-operative assessment

## 2016-04-11 ENCOUNTER — Encounter (HOSPITAL_COMMUNITY): Payer: Self-pay | Admitting: Urology

## 2016-04-11 LAB — BASIC METABOLIC PANEL
Anion gap: 8 (ref 5–15)
BUN: 9 mg/dL (ref 6–20)
CALCIUM: 8.7 mg/dL — AB (ref 8.9–10.3)
CO2: 24 mmol/L (ref 22–32)
Chloride: 103 mmol/L (ref 101–111)
Creatinine, Ser: 0.97 mg/dL (ref 0.61–1.24)
Glucose, Bld: 141 mg/dL — ABNORMAL HIGH (ref 65–99)
POTASSIUM: 4 mmol/L (ref 3.5–5.1)
Sodium: 135 mmol/L (ref 135–145)

## 2016-04-11 LAB — HEMOGLOBIN AND HEMATOCRIT, BLOOD
HEMATOCRIT: 40 % (ref 39.0–52.0)
HEMOGLOBIN: 14 g/dL (ref 13.0–17.0)

## 2016-04-11 MED ORDER — SENNA 8.6 MG PO TABS
1.0000 | ORAL_TABLET | Freq: Every day | ORAL | Status: DC
  Administered 2016-04-11: 8.6 mg via ORAL
  Filled 2016-04-11: qty 1

## 2016-04-11 MED ORDER — OXYCODONE HCL 5 MG PO TABS
10.0000 mg | ORAL_TABLET | ORAL | Status: DC | PRN
  Filled 2016-04-11: qty 2

## 2016-04-11 MED ORDER — OXYCODONE HCL 5 MG PO TABS
5.0000 mg | ORAL_TABLET | ORAL | Status: DC | PRN
  Administered 2016-04-11 (×2): 5 mg via ORAL
  Filled 2016-04-11 (×2): qty 1

## 2016-04-11 MED ORDER — DOCUSATE SODIUM 100 MG PO CAPS
100.0000 mg | ORAL_CAPSULE | Freq: Two times a day (BID) | ORAL | Status: DC
  Administered 2016-04-11: 100 mg via ORAL
  Filled 2016-04-11: qty 1

## 2016-04-11 NOTE — Progress Notes (Signed)
1 Day Post-Op Subjective: The patient is doing well.  No nausea or vomiting. Pain is adequately controlled. Excellent UOP overnight (1.2L) with only 66ml from drain. Hb 14.0 this AM from 15.3 pre-op.  Objective: Vital signs in last 24 hours: Temp:  [97.6 F (36.4 C)-98.9 F (37.2 C)] 98.5 F (36.9 C) (07/20 [id number]) Pulse Rate:  [60-85] 68 (07/20 0614) Resp:  [14-19] 18 (07/20 0614) BP: (111-136)/(68-92) 115/68 mmHg (07/20 0614) SpO2:  [97 %-100 %] 99 % (07/20 0614) Weight:  [105.688 kg (233 lb)] 105.688 kg (233 lb) (07/19 1000)  Intake/Output from previous day: 07/19 0701 - 07/20 0700 In: 3387.9 [P.O.:240; I.V.:3047.9; IV Piggyback:100] Out: 1630 [Urine:1560; Drains:65; Blood:5] Intake/Output this shift: Total I/O In: 635.8 [P.O.:240; I.V.:395.8] Out: [id number] [Urine:1200; Drains:15]  Physical Exam:  General: Alert and oriented. CV: RRR Lungs: Clear bilaterally. GI: Soft, Nondistended. Incisions: Clean and dry. Drain  With small SS fluid. Urine: Clear Extremities: Nontender, no erythema, no edema.  Lab Results:  Recent Labs  04/10/16 1551 04/11/16 0434  HGB 14.9 14.0  HCT 42.9 40.0          Recent Labs  04/11/16 0434  CREATININE 0.97           Results for orders placed or performed during the hospital encounter of 04/10/16 (from the past 24 hour(s))  Hemoglobin and hematocrit, blood     Status: None   Collection Time: 04/10/16  3:51 PM  Result Value Ref Range   Hemoglobin 14.9 13.0 - 17.0 g/dL   HCT 42.9 39.0 - [id number] %  Basic metabolic panel     Status: Abnormal   Collection Time: 04/11/16  4:34 AM  Result Value Ref Range   Sodium 135 135 - 145 mmol/L   Potassium 4.0 3.5 - 5.1 mmol/L   Chloride 103 101 - 111 mmol/L   CO2 24 22 - 32 mmol/L   Glucose, Bld 141 (H) 65 - 99 mg/dL   BUN 9 6 - 20 mg/dL   Creatinine, Ser 0.97 0.61 - 1.24 mg/dL   Calcium 8.7 (L) 8.9 - 10.3 mg/dL   GFR calc non Af Amer >60 >60 mL/min   GFR calc Af Amer >60 >60 mL/min   Anion gap 8  5 - 15  Hemoglobin and hematocrit, blood     Status: None   Collection Time: 04/11/16  4:34 AM  Result Value Ref Range   Hemoglobin 14.0 13.0 - 17.0 g/dL   HCT 40.0 39.0 - 52.0 %    Assessment/Plan: POD# 1 s/p robotic partial nephrectomy.  1) Ambulate, Incentive spirometry 2) Advance diet as tolerated 3) Transition to oral pain medication 4) D/c JP drain 5) D/C urethral catheter    LOS: 1 day   Michael Rubio 04/11/2016, 6:16 AM

## 2016-04-11 NOTE — Progress Notes (Signed)
Completed D/C teaching with patient. Answered questions. Gave prescriptions. Pt will be D/C home with family in stable condition. 

## 2016-10-08 ENCOUNTER — Encounter: Payer: Self-pay | Admitting: Sports Medicine

## 2016-10-08 ENCOUNTER — Ambulatory Visit (INDEPENDENT_AMBULATORY_CARE_PROVIDER_SITE_OTHER): Payer: 59 | Admitting: Sports Medicine

## 2016-10-08 DIAGNOSIS — C642 Malignant neoplasm of left kidney, except renal pelvis: Secondary | ICD-10-CM

## 2016-10-08 LAB — POCT URINALYSIS DIPSTICK
Bilirubin, UA: NEGATIVE
Glucose, UA: NEGATIVE
Ketones, UA: NEGATIVE
Leukocytes, UA: NEGATIVE
Nitrite, UA: NEGATIVE
Protein, UA: NEGATIVE
Spec Grav, UA: 1.025
Urobilinogen, UA: 0.2
pH, UA: 6.5

## 2016-10-08 MED ORDER — NITROFURANTOIN MONOHYD MACRO 100 MG PO CAPS: 100.0000 mg | ORAL_CAPSULE | Freq: Two times a day (BID) | ORAL | 0 refills | Status: DC

## 2016-10-08 NOTE — Progress Notes (Signed)
  Subjective:    CC: Abdominal pain  HPI: This is a pleasant 36 year old male, he is 5 months post-wedge nephrectomy for a left renal cell carcinoma, did well until recently, started to have new onset flank pain with hematuria. Symptoms are moderate, worsening.  Past medical history:  Negative.  See flowsheet/record as well for more information.  Surgical history: Negative.  See flowsheet/record as well for more information.  Family history: Negative.  See flowsheet/record as well for more information.  Social history: Negative.  See flowsheet/record as well for more information.  Allergies, and medications have been entered into the medical record, reviewed, and no changes needed.   Review of Systems: No fevers, chills, night sweats, weight loss, chest pain, or shortness of breath.   Objective:    General: Well Developed, well nourished, and in no acute distress.  Neuro: Alert and oriented x3, extra-ocular muscles intact, sensation grossly intact.  HEENT: Normocephalic, atraumatic, pupils equal round reactive to light, neck supple, no masses, no lymphadenopathy, thyroid nonpalpable.  Skin: Warm and dry, no rashes. Cardiac: Regular rate and rhythm, no murmurs rubs or gallops, no lower extremity edema.  Respiratory: Clear to auscultation bilaterally. Not using accessory muscles, speaking in full sentences. Abdomen: Soft, minimally tender in the left flank and the left costovertebral angle. No guarding, rigidity, no rebound tenderness.  Urinalysis is positive for blood.  Impression and Recommendations:    Renal cell carcinoma of left kidney (HCC) Approximately 5 months post robotic-assisted excision. There is some hematuria on the urinalysis, he is having left flank and lower quadrant pain. Adding Macrobid, urine culture, labs. I would also like a CT with oral and IV contrast to evaluate for any surgical complications.  I spent 25 minutes with this patient, greater than 50% was  face-to-face time counseling regarding the above diagnoses

## 2016-10-08 NOTE — Assessment & Plan Note (Signed)
Approximately 5 months post robotic-assisted excision. There is some hematuria on the urinalysis, he is having left flank and lower quadrant pain. Adding Macrobid, urine culture, labs. I would also like a CT with oral and IV contrast to evaluate for any surgical complications.

## 2016-10-09 ENCOUNTER — Other Ambulatory Visit: Payer: 59

## 2016-10-09 LAB — COMPREHENSIVE METABOLIC PANEL
Albumin: 4.3 g/dL (ref 3.6–5.1)
CO2: 28 mmol/L (ref 20–31)
Calcium: 9.1 mg/dL (ref 8.6–10.3)
Chloride: 105 mmol/L (ref 98–110)
Creat: 1.02 mg/dL (ref 0.60–1.35)
Potassium: 4.3 mmol/L (ref 3.5–5.3)
Sodium: 141 mmol/L (ref 135–146)
Total Protein: 7 g/dL (ref 6.1–8.1)

## 2016-10-09 LAB — CBC WITH DIFFERENTIAL/PLATELET
Basophils Absolute: 86 cells/uL (ref 0–200)
Basophils Relative: 1 %
Eosinophils Absolute: 172 cells/uL (ref 15–500)
Eosinophils Relative: 2 %
HCT: 45.4 % (ref 38.5–50.0)
Hemoglobin: 15.6 g/dL (ref 13.2–17.1)
Lymphocytes Relative: 33 %
Lymphs Abs: 2838 cells/uL (ref 850–3900)
MCH: 29.8 pg (ref 27.0–33.0)
MCHC: 34.4 g/dL (ref 32.0–36.0)
MCV: 86.6 fL (ref 80.0–100.0)
MPV: 10 fL (ref 7.5–12.5)
Monocytes Absolute: 946 {cells}/uL (ref 200–950)
Monocytes Relative: 11 %
Neutro Abs: 4558 {cells}/uL (ref 1500–7800)
Neutrophils Relative %: 53 %
Platelets: 230 K/uL (ref 140–400)
RBC: 5.24 MIL/uL (ref 4.20–5.80)
RDW: 13.9 % (ref 11.0–15.0)
WBC: 8.6 K/uL (ref 3.8–10.8)

## 2016-10-09 LAB — LIPASE: Lipase: 35 U/L (ref 7–60)

## 2016-10-09 LAB — COMPREHENSIVE METABOLIC PANEL WITH GFR
ALT: 40 U/L (ref 9–46)
AST: 25 U/L (ref 10–40)
Alkaline Phosphatase: 47 U/L (ref 40–115)
BUN: 16 mg/dL (ref 7–25)
Glucose, Bld: 95 mg/dL (ref 65–99)
Total Bilirubin: 0.4 mg/dL (ref 0.2–1.2)

## 2016-10-09 LAB — AMYLASE: Amylase: 29 U/L (ref 0–105)

## 2016-10-10 LAB — URINE CULTURE: Organism ID, Bacteria: NO GROWTH

## 2016-10-11 ENCOUNTER — Ambulatory Visit (INDEPENDENT_AMBULATORY_CARE_PROVIDER_SITE_OTHER): Payer: 59

## 2016-10-11 DIAGNOSIS — K5900 Constipation, unspecified: Secondary | ICD-10-CM

## 2016-10-11 DIAGNOSIS — C642 Malignant neoplasm of left kidney, except renal pelvis: Secondary | ICD-10-CM

## 2016-10-11 DIAGNOSIS — R1032 Left lower quadrant pain: Secondary | ICD-10-CM

## 2016-10-11 DIAGNOSIS — G8918 Other acute postprocedural pain: Secondary | ICD-10-CM | POA: Diagnosis not present

## 2016-10-11 MED ORDER — IOPAMIDOL (ISOVUE-300) INJECTION 61%
100.0000 mL | Freq: Once | INTRAVENOUS | Status: AC | PRN
  Administered 2016-10-11: 100 mL via INTRAVENOUS

## 2016-10-22 ENCOUNTER — Encounter: Payer: Self-pay | Admitting: Sports Medicine

## 2016-10-22 ENCOUNTER — Ambulatory Visit (INDEPENDENT_AMBULATORY_CARE_PROVIDER_SITE_OTHER): Payer: 59 | Admitting: Sports Medicine

## 2016-10-22 DIAGNOSIS — E6609 Other obesity due to excess calories: Secondary | ICD-10-CM

## 2016-10-22 MED ORDER — PHENTERMINE HCL 15 MG PO TBDP: 1.0000 | ORAL_TABLET | Freq: Every day | ORAL | 0 refills | Status: DC

## 2016-10-22 NOTE — Progress Notes (Signed)
  Subjective:    CC: Follow-up  HPI: Abdominal pain: Resolved with treating his constipation.  Obesity:  Desires to restart phentermine.  Past medical history:  Negative.  See flowsheet/record as well for more information.  Surgical history: Negative.  See flowsheet/record as well for more information.  Family history: Negative.  See flowsheet/record as well for more information.  Social history: Negative.  See flowsheet/record as well for more information.  Allergies, and medications have been entered into the medical record, reviewed, and no changes needed.   Review of Systems: No fevers, chills, night sweats, weight loss, chest pain, or shortness of breath.   Objective:    General: Well Developed, well nourished, and in no acute distress.  Neuro: Alert and oriented x3, extra-ocular muscles intact, sensation grossly intact.  HEENT: Normocephalic, atraumatic, pupils equal round reactive to light, neck supple, no masses, no lymphadenopathy, thyroid nonpalpable.  Skin: Warm and dry, no rashes. Cardiac: Regular rate and rhythm, no murmurs rubs or gallops, no lower extremity edema.  Respiratory: Clear to auscultation bilaterally. Not using accessory muscles, speaking in full sentences.  Impression and Recommendations:    Obesity Start low-dose phentermine, remote given, return in 3 months.  I spent 25 minutes with this patient, greater than 50% was face-to-face time counseling regarding the above diagnoses

## 2016-10-22 NOTE — Assessment & Plan Note (Signed)
Start low-dose phentermine, remote given, return in 3 months.

## 2016-12-06 LAB — HM COLONOSCOPY

## 2017-01-17 ENCOUNTER — Ambulatory Visit: Payer: 59 | Admitting: Sports Medicine

## 2017-02-03 ENCOUNTER — Ambulatory Visit (INDEPENDENT_AMBULATORY_CARE_PROVIDER_SITE_OTHER): Payer: 59 | Admitting: Sports Medicine

## 2017-02-03 ENCOUNTER — Encounter: Payer: Self-pay | Admitting: Sports Medicine

## 2017-02-03 DIAGNOSIS — E6609 Other obesity due to excess calories: Secondary | ICD-10-CM

## 2017-02-03 DIAGNOSIS — C642 Malignant neoplasm of left kidney, except renal pelvis: Secondary | ICD-10-CM

## 2017-02-03 MED ORDER — PHENTERMINE HCL 15 MG PO TBDP: 1.0000 | ORAL_TABLET | Freq: Every day | ORAL | 0 refills | Status: DC

## 2017-02-03 NOTE — Assessment & Plan Note (Signed)
Doing well post wedge nephrectomy, he does have a follow-up urology coming up in a couple of months.

## 2017-02-03 NOTE — Progress Notes (Signed)
  Subjective:    CC: Follow-up  HPI: Obesity: Good weight loss after 3 months of low-dose phentermine.  Preventative measures: Would like to do his physical at some point, requests routine blood work.  Past medical history:  Negative.  See flowsheet/record as well for more information.  Surgical history: Negative.  See flowsheet/record as well for more information.  Family history: Negative.  See flowsheet/record as well for more information.  Social history: Negative.  See flowsheet/record as well for more information.  Allergies, and medications have been entered into the medical record, reviewed, and no changes needed.   Review of Systems: No fevers, chills, night sweats, weight loss, chest pain, or shortness of breath.   Objective:    General: Well Developed, well nourished, and in no acute distress.  Neuro: Alert and oriented x3, extra-ocular muscles intact, sensation grossly intact.  HEENT: Normocephalic, atraumatic, pupils equal round reactive to light, neck supple, no masses, no lymphadenopathy, thyroid nonpalpable.  Skin: Warm and dry, no rashes. Cardiac: Regular rate and rhythm, no murmurs rubs or gallops, no lower extremity edema.  Respiratory: Clear to auscultation bilaterally. Not using accessory muscles, speaking in full sentences.  Impression and Recommendations:    Obesity After 2 months of low-dose phentermine he has lost 17 pounds, refilling medication, return in 3 months. He will continue with calorie counting and a home exercise program. Ordering routine labs.  Renal cell carcinoma of left kidney [facility]) Doing well post wedge nephrectomy, he does have a follow-up urology coming up in a couple of months.  I spent 25 minutes with this patient, greater than 50% was face-to-face time counseling regarding the above diagnoses

## 2017-02-03 NOTE — Assessment & Plan Note (Signed)
After 2 months of low-dose phentermine he has lost 17 pounds, refilling medication, return in 3 months. He will continue with calorie counting and a home exercise program. Ordering routine labs.

## 2017-03-11 ENCOUNTER — Encounter: Payer: 59 | Admitting: Sports Medicine

## 2017-03-16 ENCOUNTER — Encounter: Payer: Self-pay | Admitting: Sports Medicine

## 2017-08-21 ENCOUNTER — Other Ambulatory Visit: Payer: Self-pay

## 2017-08-21 ENCOUNTER — Emergency Department
Admission: EM | Admit: 2017-08-21 | Discharge: 2017-08-21 | Disposition: A | Discharge status: Home / Self Care | Payer: 59 | Source: Home / Self Care | Attending: Family Medicine | Admitting: Family Medicine

## 2017-08-21 ENCOUNTER — Encounter: Payer: Self-pay | Admitting: *Deleted

## 2017-08-21 DIAGNOSIS — H6981 Other specified disorders of Eustachian tube, right ear: Secondary | ICD-10-CM

## 2017-08-21 DIAGNOSIS — H8110 Benign paroxysmal vertigo, unspecified ear: Secondary | ICD-10-CM

## 2017-08-21 DIAGNOSIS — H6991 Unspecified Eustachian tube disorder, right ear: Secondary | ICD-10-CM

## 2017-08-21 MED ORDER — PREDNISONE 20 MG PO TABS: ORAL_TABLET | ORAL | 0 refills | Status: DC

## 2017-08-21 MED ORDER — MECLIZINE HCL 25 MG PO TABS: ORAL_TABLET | ORAL | 0 refills | Status: DC

## 2017-08-21 NOTE — ED Provider Notes (Signed)
[facility]    CSN: [id number] Arrival date & time: 08/21/17  2952     History   Chief Complaint Chief Complaint  Patient presents with  . Ear Fullness  . Dizziness    HPI Michael Rubio is a 36 y.o. male.   Yesterday morning patient noticed a sense of mild dizziness (Head spinning) with movement, and continues to have mild disequilibrium with movement today.  Today he awoke with sinus congestion, post-nasal drainage, and sense of fullness in his right ear.  No sore throat, cough, or fevers, chills, and sweats.  No headache, fatigue, or myalgias.   The history is provided by the patient.    Past Medical History:  Diagnosis Date  . Asthma    as a child  . Chest pain, atypical 2012   possible angina per pt- stress test neg  . Chronic kidney disease    renal mass  . Complication of anesthesia    woke up in surgery x2- no recall  . Lumbar radiculopathy, chronic right S1 06/24/2012    Patient Active Problem List   Diagnosis Date Noted  . Renal cell carcinoma of left kidney ([place]) 02/26/2016  . Annual physical exam 01/20/2013  . Obesity 06/24/2012  . Lumbar radiculopathy, chronic right S1 06/24/2012  . Mass in neck 06/08/2012  . Gastritis 06/08/2012    Past Surgical History:  Procedure Laterality Date  . ROBOTIC ASSITED PARTIAL NEPHRECTOMY Left 04/10/2016   Procedure: XI ROBOTIC ASSITED LEFT  PARTIAL NEPHRECTOMY;  Surgeon: Ardis Hughs, MD;  Location: [facility];  Service: Urology;  Laterality: Left;  Clamp on: 1408 Clamp off: 1420 Total clamp time: 12 minutes  . WISDOM TOOTH EXTRACTION    . WRIST SURGERY         Home Medications    Prior to Admission medications   Medication Sig Start Date End Date Taking? Authorizing Provider  meclizine (ANTIVERT) 25 MG tablet Take one tab by mouth 2 or 3 times daily as needed for dizziness 08/21/17   Kandra Nicolas, MD  Phentermine HCl 15 MG TBDP Take 1 tablet by mouth daily. 02/03/17   Silverio Decamp, MD  predniSONE (DELTASONE) 20 MG tablet Take one tab by mouth twice daily for 4 days, then one daily for 3 days. Take with food. 08/21/17   Kandra Nicolas, MD    Family History Family History  Problem Relation Age of Onset  . Heart attack Mother   . Heart disease Mother   . Heart attack Father   . Heart disease Father   . Heart disease Maternal Grandmother   . Diabetes Maternal Grandmother   . Heart disease Maternal Grandfather   . COPD Paternal Grandfather   . Heart disease Paternal Grandfather     Social History Social History   Tobacco Use  . Smoking status: Former Smoker    Packs/day: 1.00    Years: 10.00    Pack years: 10.00    Types: Cigarettes    Last attempt to quit: 10/06/2010    Years since quitting: 6.8  . Smokeless tobacco: Current User    Types: Snuff, Chew  Substance Use Topics  . Alcohol use: Yes    Alcohol/week: 6.0 oz    Types: 10 Cans of beer per week  . Drug use: No     Allergies   Ciprofloxacin and Penicillins   Review of Systems Review of Systems No sore throat No cough No pleuritic pain No wheezing + nasal  congestion + post-nasal drainage No sinus pain/pressure No itchy/red eyes ? Right earache + dizzy No hemoptysis No SOB No fever/chills No nausea No vomiting No abdominal pain No diarrhea No urinary symptoms No skin rash + fatigue No myalgias No headache    Physical Exam Triage Vital Signs ED Triage Vitals [08/21/17 0839]  Enc Vitals Group     BP 119/81     Pulse Rate 63     Resp 16     Temp 98.3 F (36.8 C)     Temp Source Oral     SpO2 97 %     Weight 245 lb (111.1 kg)     Height      Head Circumference      Peak Flow      Pain Score 0     Pain Loc      Pain Edu?      Excl. in [place]?    No data found.  Updated Vital Signs BP 119/81 (BP Location: Left Arm)   Pulse 63   Temp 98.3 F (36.8 C) (Oral)   Resp 16   Wt 245 lb (111.1 kg)   SpO2 97%   BMI 34.17 kg/m   Visual Acuity Right Eye  Distance:   Left Eye Distance:   Bilateral Distance:    Right Eye Near:   Left Eye Near:    Bilateral Near:     Physical Exam Nursing notes and Vital Signs reviewed. Appearance:  Patient appears stated age, and in no acute distress Eyes:  Pupils are equal, round, and reactive to light and accomodation.  Extraocular movement is intact. No nystagmus noted. Conjunctivae are not inflamed Ears:  Canals normal.  Tympanic membranes normal.  Nose:  Congested turbinates.  No sinus tenderness.    Pharynx:  Normal Neck:  Supple.  Enlarged posterior/lateral nodes are palpated bilaterally, tender to palpation on the left.   Lungs:  Clear to auscultation.  Breath sounds are equal.  Moving air well. Heart:  Regular rate and rhythm without murmurs, rubs, or gallops.  Abdomen:  Nontender without masses or hepatosplenomegaly.  Bowel sounds are present.  No CVA or flank tenderness.  Extremities:  No edema.  Skin:  No rash present.    UC Treatments / Results  Labs (all labs ordered are listed, but only abnormal results are displayed) Labs Reviewed - No data to display  EKG  EKG Interpretation None       Radiology No results found.  Procedures Procedures (including critical care time)  Medications Ordered in UC Medications - No data to display   Initial Impression / Assessment and Plan / UC Course  I have reviewed the triage vital signs and the nursing notes.  Pertinent labs & imaging results that were available during my care of the patient were reviewed by me and considered in my medical decision making (see chart for details).    There is no evidence of bacterial infection today.   Suspect early viral URI. Begin prednisone burst/taper, and Antivert. May take Pseudoephedrine (30mg , one or two every 4 to 6 hours) for sinus and ear congestion.    If cold-like symptoms develop, try the following: Take plain guaifenesin (1200mg  extended release tabs such as Mucinex) twice daily, with  plenty of water, for cough and congestion.  Get adequate rest.   May use Afrin nasal spray (or generic oxymetazoline) each morning for about 5 days and then discontinue.  Also recommend using saline nasal spray several times daily and  saline nasal irrigation (AYR is a common brand).  Use Flonase nasal spray each morning after using Afrin nasal spray and saline nasal irrigation. Try warm salt water gargles for sore throat.  Stop all antihistamines for now, and other non-prescription cough/cold preparations. May take Delsym Cough Suppressant at bedtime for nighttime cough.  Follow-up with family doctor if not improving about 7 to 10 days.     Final Clinical Impressions(s) / UC Diagnoses   Final diagnoses:  Eustachian tube dysfunction, right  Benign paroxysmal positional vertigo, unspecified laterality    ED Discharge Orders        Ordered    predniSONE (DELTASONE) 20 MG tablet     08/21/17 0859    meclizine (ANTIVERT) 25 MG tablet     08/21/17 0859          Kandra Nicolas, MD 08/21/17 0930

## 2017-08-21 NOTE — Discharge Instructions (Signed)
May take Pseudoephedrine (30mg , one or two every 4 to 6 hours) for sinus and ear congestion.    If cold-like symptoms develop, try the following: Take plain guaifenesin (1200mg  extended release tabs such as Mucinex) twice daily, with plenty of water, for cough and congestion.  Get adequate rest.   May use Afrin nasal spray (or generic oxymetazoline) each morning for about 5 days and then discontinue.  Also recommend using saline nasal spray several times daily and saline nasal irrigation (AYR is a common brand).  Use Flonase nasal spray each morning after using Afrin nasal spray and saline nasal irrigation. Try warm salt water gargles for sore throat.  Stop all antihistamines for now, and other non-prescription cough/cold preparations. May take Delsym Cough Suppressant at bedtime for nighttime cough.  Follow-up with family doctor if not improving about 7 to 10 days.

## 2017-08-21 NOTE — ED Triage Notes (Signed)
Patient c/o right ear pressure and congestion with dizziness x yesterday. Dizziness with eye movement and change in position.

## 2017-11-04 ENCOUNTER — Other Ambulatory Visit: Payer: Self-pay

## 2017-11-04 ENCOUNTER — Encounter: Payer: Self-pay | Admitting: Emergency Medicine

## 2017-11-04 ENCOUNTER — Emergency Department
Admission: EM | Admit: 2017-11-04 | Discharge: 2017-11-04 | Disposition: A | Discharge status: Home / Self Care | Payer: 59 | Source: Home / Self Care | Attending: Family Medicine | Admitting: Family Medicine

## 2017-11-04 DIAGNOSIS — R69 Illness, unspecified: Secondary | ICD-10-CM | POA: Diagnosis not present

## 2017-11-04 DIAGNOSIS — J111 Influenza due to unidentified influenza virus with other respiratory manifestations: Secondary | ICD-10-CM

## 2017-11-04 MED ORDER — OSELTAMIVIR PHOSPHATE 75 MG PO CAPS: 75.0000 mg | ORAL_CAPSULE | Freq: Two times a day (BID) | ORAL | 0 refills | Status: DC

## 2017-11-04 NOTE — ED Provider Notes (Signed)
[facility]    CSN: [id number] Arrival date & time: 11/04/17  1015     History   Chief Complaint Chief Complaint  Patient presents with  . URI    HPI COSTAS SENA is a 37 y.o. male.   Four days ago patient developed sinus congestion but did not feel ill.  Yesterday he developed a cough, headache, myalgias, fatigue, and chills. He states that there are numerous people at work with the flu.   The history is provided by the patient.    Past Medical History:  Diagnosis Date  . Asthma    as a child  . Chest pain, atypical 2012   possible angina per pt- stress test neg  . Chronic kidney disease    renal mass  . Complication of anesthesia    woke up in surgery x2- no recall  . Lumbar radiculopathy, chronic right S1 06/24/2012    Patient Active Problem List   Diagnosis Date Noted  . Renal cell carcinoma of left kidney ([place]) 02/26/2016  . Annual physical exam 01/20/2013  . Obesity 06/24/2012  . Lumbar radiculopathy, chronic right S1 06/24/2012  . Mass in neck 06/08/2012  . Gastritis 06/08/2012    Past Surgical History:  Procedure Laterality Date  . ROBOTIC ASSITED PARTIAL NEPHRECTOMY Left 04/10/2016   Procedure: XI ROBOTIC ASSITED LEFT  PARTIAL NEPHRECTOMY;  Surgeon: Ardis Hughs, MD;  Location: [facility];  Service: Urology;  Laterality: Left;  Clamp on: 1408 Clamp off: 1420 Total clamp time: 12 minutes  . WISDOM TOOTH EXTRACTION    . WRIST SURGERY         Home Medications    Prior to Admission medications   Medication Sig Start Date End Date Taking? Authorizing Provider  Ascorbic Acid (VITAMIN C) 100 MG tablet Take 100 mg by mouth daily.   Yes [provider]  Zinc Sulfate (ZINC 15 PO) Take by mouth.   Yes [provider]  oseltamivir (TAMIFLU) 75 MG capsule Take 1 capsule (75 mg total) by mouth every 12 (twelve) hours. 11/04/17   Kandra Nicolas, MD    Family History Family History  Problem Relation Age of Onset    . Heart attack Mother   . Heart disease Mother   . Heart attack Father   . Heart disease Father   . Heart disease Maternal Grandmother   . Diabetes Maternal Grandmother   . Heart disease Maternal Grandfather   . COPD Paternal Grandfather   . Heart disease Paternal Grandfather     Social History Social History   Tobacco Use  . Smoking status: Former Smoker    Packs/day: 1.00    Years: 10.00    Pack years: 10.00    Types: Cigarettes    Last attempt to quit: 10/06/2010    Years since quitting: 7.0  . Smokeless tobacco: Current User    Types: Snuff, Chew  Substance Use Topics  . Alcohol use: Yes    Alcohol/week: 6.0 oz    Types: 10 Cans of beer per week  . Drug use: No     Allergies   Ciprofloxacin and Penicillins   Review of Systems Review of Systems ? sore throat + cough No pleuritic pain No wheezing + nasal congestion + post-nasal drainage No sinus pain/pressure No itchy/red eyes No earache No hemoptysis No SOB No fever, + chills No nausea No vomiting No abdominal pain No diarrhea No urinary symptoms No skin rash + fatigue + myalgias + headache  Used OTC meds without relief   Physical Exam Triage Vital Signs ED Triage Vitals  Enc Vitals Group     BP 11/04/17 1133 123/82     Pulse Rate 11/04/17 1133 78     Resp --      Temp 11/04/17 1133 97.8 F (36.6 C)     Temp Source 11/04/17 1133 Oral     SpO2 11/04/17 1133 98 %     Weight 11/04/17 1134 249 lb (112.9 kg)     Height 11/04/17 1134 6' (1.829 m)     Head Circumference --      Peak Flow --      Pain Score 11/04/17 1134 5     Pain Loc --      Pain Edu? --      Excl. in [place]? --    No data found.  Updated Vital Signs BP 123/82 (BP Location: Right Arm)   Pulse 78   Temp 97.8 F (36.6 C) (Oral)   Ht 6' (1.829 m)   Wt 249 lb (112.9 kg)   SpO2 98%   BMI 33.77 kg/m   Visual Acuity Right Eye Distance:   Left Eye Distance:   Bilateral Distance:    Right Eye Near:   Left Eye Near:     Bilateral Near:     Physical Exam Nursing notes and Vital Signs reviewed. Appearance:  Patient appears stated age, and in no acute distress Eyes:  Pupils are equal, round, and reactive to light and accomodation.  Extraocular movement is intact.  Conjunctivae are not inflamed  Ears:  Canals normal.  Tympanic membranes normal.  Nose:  Congested turbinates.  No sinus tenderness. Pharynx:  Normal Neck:  Supple.  Enlarged posterior/lateral nodes are palpated bilaterally, tender to palpation on the left.   Lungs:  Clear to auscultation.  Breath sounds are equal.  Moving air well. Heart:  Regular rate and rhythm without murmurs, rubs, or gallops.  Abdomen:  Nontender without masses or hepatosplenomegaly.  Bowel sounds are present.  No CVA or flank tenderness.  Extremities:  No edema.  Skin:  No rash present.    UC Treatments / Results  Labs (all labs ordered are listed, but only abnormal results are displayed) Labs Reviewed - No data to display  EKG  EKG Interpretation None       Radiology No results found.  Procedures Procedures (including critical care time)  Medications Ordered in UC Medications - No data to display   Initial Impression / Assessment and Plan / UC Course  I have reviewed the triage vital signs and the nursing notes.  Pertinent labs & imaging results that were available during my care of the patient were reviewed by me and considered in my medical decision making (see chart for details).    Begin Tamiflu. Take plain guaifenesin (1200mg  extended release tabs such as Mucinex) twice daily, with plenty of water, for cough and congestion.  May add Pseudoephedrine (30mg , one or two every 4 to 6 hours) for sinus congestion.  Get adequate rest.   May use Afrin nasal spray (or generic oxymetazoline) each morning for about 5 days and then discontinue.  Also recommend using saline nasal spray several times daily and saline nasal irrigation (AYR is a common brand).  Use  Flonase nasal spray each morning after using Afrin nasal spray and saline nasal irrigation. Try warm salt water gargles for sore throat.  Stop all antihistamines for now, and other non-prescription cough/cold preparations. May take Ibuprofen  200mg , 4 tabs every 8 hours with food for body aches, headache, fever, etc. May take Delsym Cough Suppressant at bedtime for nighttime cough.  Followup with Family Doctor if not improved in one week.   Final Clinical Impressions(s) / UC Diagnoses   Final diagnoses:  Influenza-like illness    ED Discharge Orders        Ordered    oseltamivir (TAMIFLU) 75 MG capsule  Every 12 hours     11/04/17 1158          Kandra Nicolas, MD 11/15/17 1258

## 2017-11-04 NOTE — ED Triage Notes (Signed)
Headache, congestion, runny nose, sinus pressure, cough, body aches x 4 days

## 2017-11-04 NOTE — Discharge Instructions (Signed)
Take plain guaifenesin (1200mg extended release tabs such as Mucinex) twice daily, with plenty of water, for cough and congestion.  May add Pseudoephedrine (30mg, one or two every 4 to 6 hours) for sinus congestion.  Get adequate rest.   °May use Afrin nasal spray (or generic oxymetazoline) each morning for about 5 days and then discontinue.  Also recommend using saline nasal spray several times daily and saline nasal irrigation (AYR is a common brand).  Use Flonase nasal spray each morning after using Afrin nasal spray and saline nasal irrigation. °Try warm salt water gargles for sore throat.  °Stop all antihistamines for now, and other non-prescription cough/cold preparations. °May take Ibuprofen 200mg, 4 tabs every 8 hours with food for body aches, headache, fever, etc. °May take Delsym Cough Suppressant at bedtime for nighttime cough.  °

## 2018-02-28 IMAGING — MR MR ABDOMEN WO/W CM
16 of 17 series · 44 of 48 positions shown · IV contrast (multihance)
Comparison: Abdominal pelvic CT 02/26/2016

CLINICAL DATA: Indeterminate left renal mass on CT performed for
occasional left-sided abdominal pain. No history of malignancy.

EXAM:
MRI ABDOMEN WITHOUT AND WITH CONTRAST
TECHNIQUE: Multiplanar multisequence MR imaging of the abdomen was performed
both before and after the administration of intravenous contrast.
CONTRAST:  20mL MULTIHANCE GADOBENATE DIMEGLUMINE 529 MG/ML IV SOLN

[Series 17: cor ssfse / · coronal · 7.0mm · 1.48mm/px · 3 of 35 slices shown]
[im 1/35]
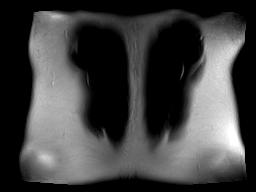
[im 18/35]
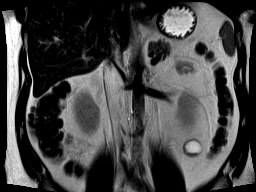
[im 35/35]
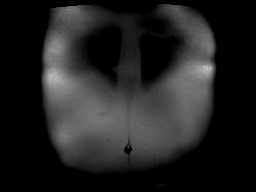

[Series 18: T2 · axial · 6.0mm · 1.48mm/px · z∈[-168,+40]mm · 2 of 30 slices shown]
[im 1/30]
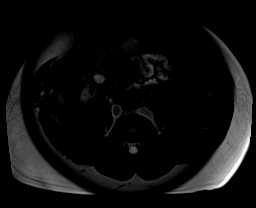
[im 30/30]
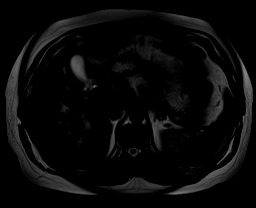

[Series 19: T1 · axial · 6.0mm · 0.74mm/px · z∈[-168,+40]mm · 4 of 60 slices shown]
[im 1/60]
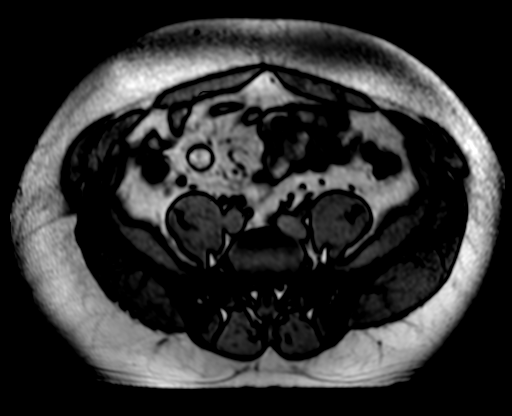
[im 20/60]
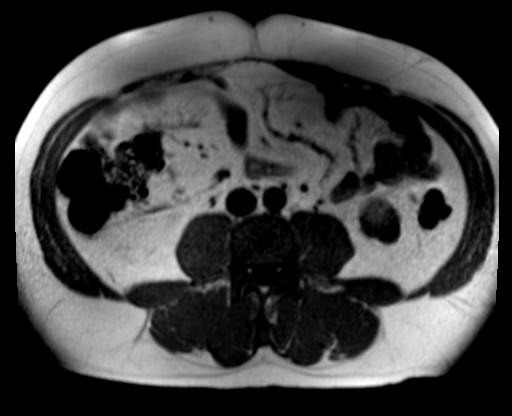
[im 40/60]
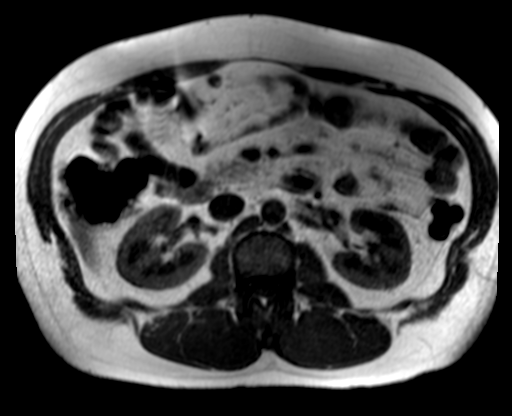
[im 60/60]
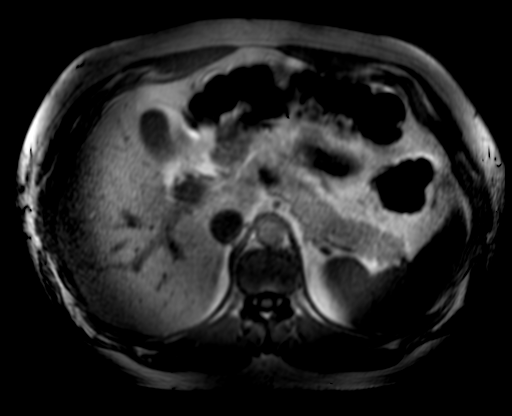

[Series 20: bSSFP · axial · 6.0mm · 0.74mm/px · z∈[-161,+48]mm · 2 of 30 slices shown]
[im 1/30]
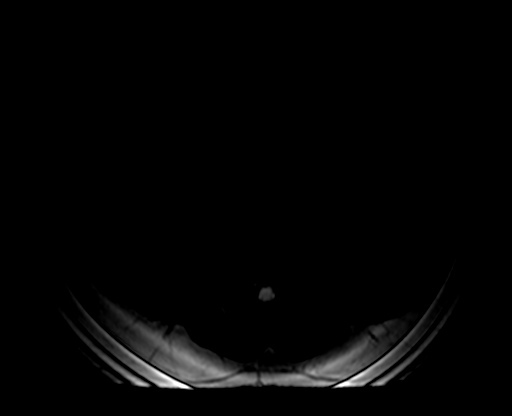
[im 30/30]
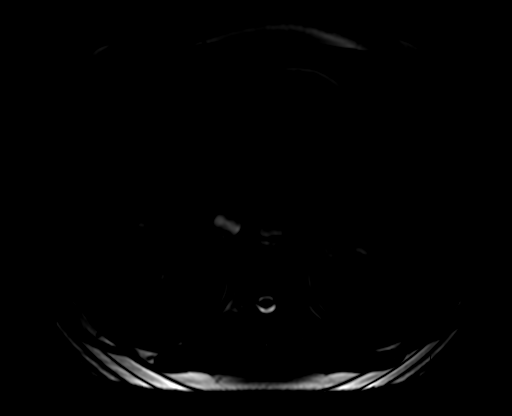

[Series 23: DWI · axial · 6.0mm · 2.00mm/px · z∈[-135,+74]mm · 4 of 90 slices shown (1 of 2)]
[im 1/90]
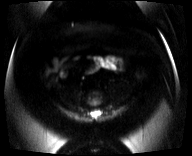
[im 30/90]
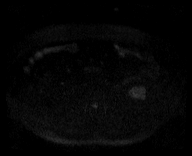
[im 60/90]
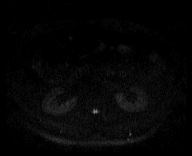
[im 90/90]
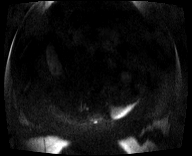

[Series 24: DWI · axial · 6.0mm · 2.00mm/px · 1 of 30 slices shown (2 of 2)]
[im 1/30]
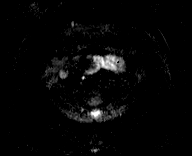

[Series 25: axial dynamic pre · axial · non-contrast · 4.0mm · 1.19mm/px · z∈[-206,+78]mm · 3 of 72 slices shown]
[im 1/72]
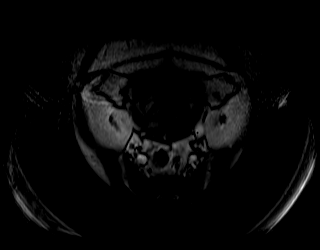
[im 36/72]
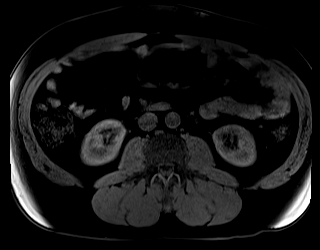
[im 72/72]
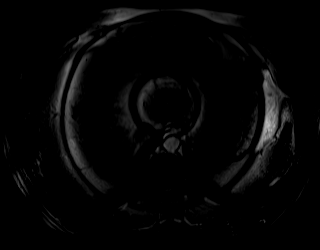

[Series 26: axial dynamic post · axial · 4.0mm · 1.19mm/px · z∈[-206,+78]mm · 3 of 72 slices shown (1 of 3)]
[im 1/72]
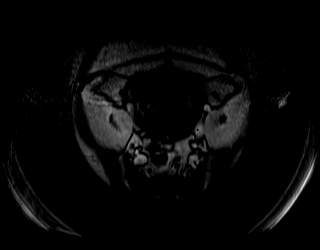
[im 36/72]
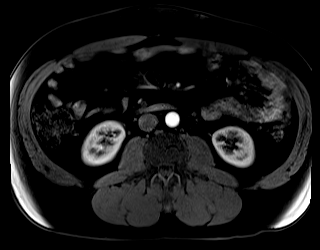
[im 72/72]
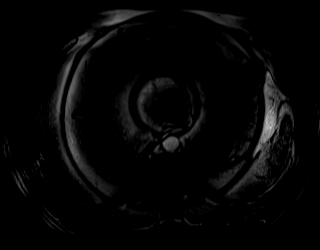

[Series 27: axial dynamic post · axial · 4.0mm · 1.19mm/px · z∈[-206,+78]mm · 3 of 72 slices shown (2 of 3)]
[im 1/72]
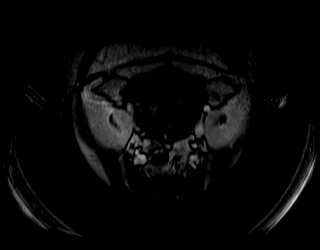
[im 36/72]
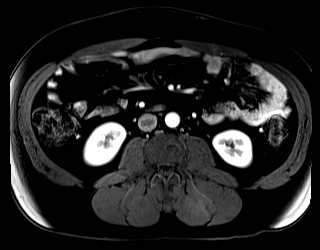
[im 72/72]
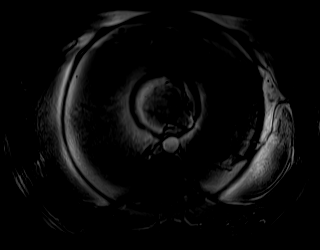

[Series 28: axial dynamic post · axial · 4.0mm · 1.19mm/px · z∈[-206,+78]mm · 3 of 72 slices shown (3 of 3)]
[im 1/72]
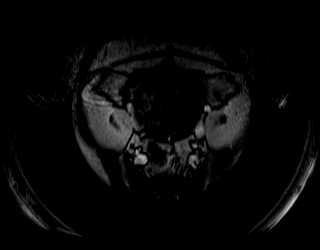
[im 36/72]
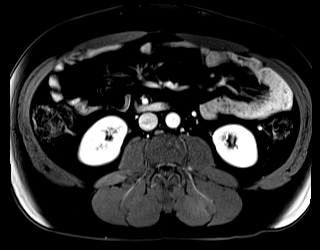
[im 72/72]
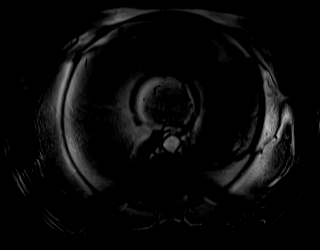

[Series 30: axial ssfse / · axial · 6.0mm · 1.19mm/px · 1 of 30 slices shown]
[im 1/30]
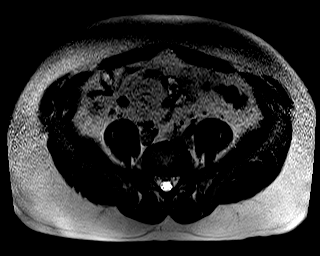

[Series 31: axial dynamic 3 · axial · 4.0mm · 1.19mm/px · z∈[-206,+78]mm · 3 of 72 slices shown]
[im 1/72]
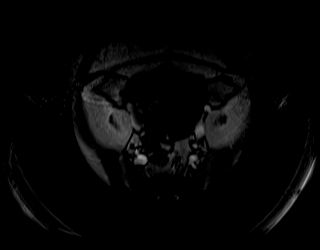
[im 36/72]
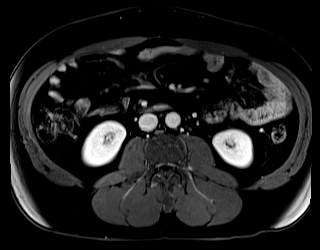
[im 72/72]
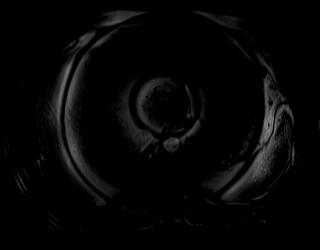

[Series 107: immed sub · axial · 4.0mm · 1.19mm/px · z∈[-206,+78]mm · 3 of 72 slices shown]
[im 1/72]
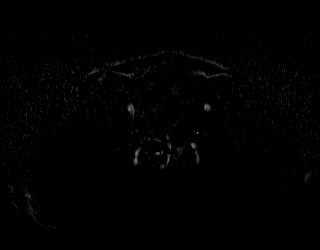
[im 36/72]
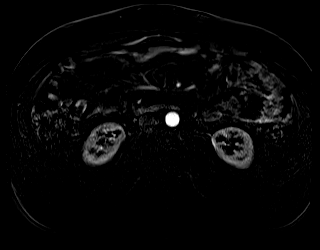
[im 72/72]
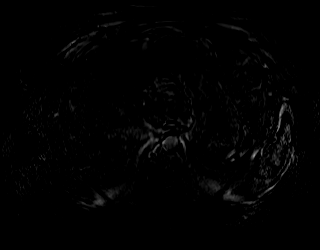

[Series 108: (id) sub · axial · 4.0mm · 1.19mm/px · z∈[-206,+78]mm · 3 of 72 slices shown (1 of 2)]
[im 1/72]
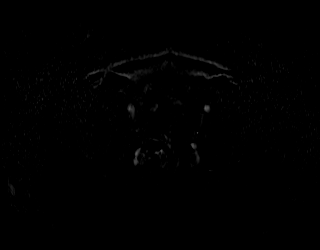
[im 36/72]
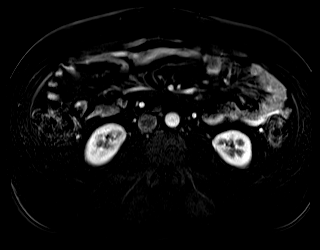
[im 72/72]
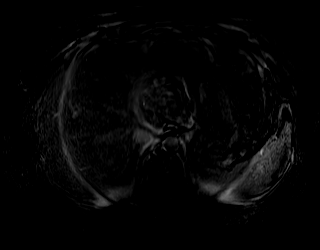

[Series 109: (id) sub · axial · 4.0mm · 1.19mm/px · z∈[-206,+78]mm · 3 of 72 slices shown (2 of 2)]
[im 1/72]
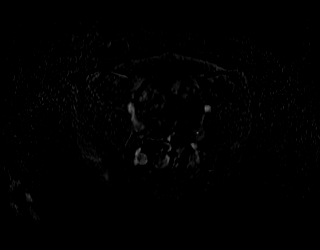
[im 36/72]
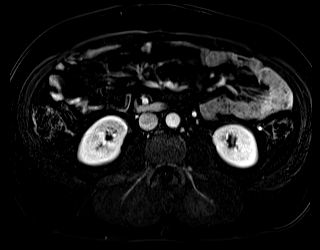
[im 72/72]
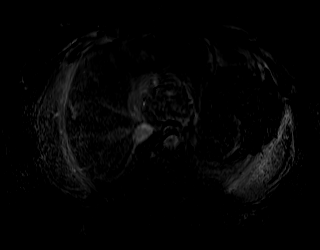

[Series 110: 3min sub · axial · 4.0mm · 1.19mm/px · z∈[-206,+78]mm · 3 of 72 slices shown]
[im 1/72]
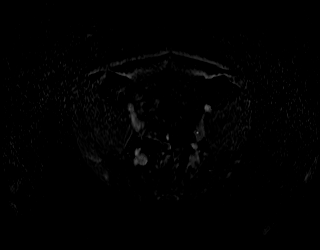
[im 36/72]
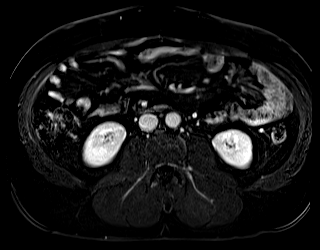
[im 72/72]
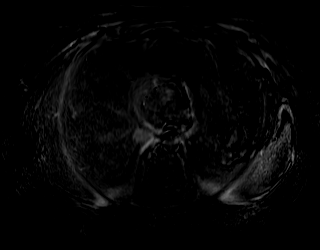

[44 of 48 positions shown; findings below may reference images not displayed]

FINDINGS: This study was initially performed on 03/11/2016. The exophytic
lesion of concern involving the lower pole of the left kidney was
suboptimally imaged by the original study, not fully included on the
axial images. The patient was returned for repeat imaging on
03/18/2016.

Lower chest:  Unremarkable.

Hepatobiliary: There is a tiny cyst anteriorly in the left hepatic
lobe. No suspicious hepatic lesions or abnormal enhancement. No
evidence of gallstones, gallbladder wall thickening or biliary
dilatation.

Pancreas: Unremarkable. No pancreatic ductal dilatation or
surrounding inflammatory changes.

Spleen: Normal in size without focal abnormality.

Adrenals/Urinary Tract: Both adrenal glands appear normal. The
cystic lesion of concern in the lower pole of the left kidney
measures 3.7 x 3.1 x 4.2 cm. This lesion is septated and
multilobulated. The septations are thicker than hairline, but
demonstrate no definite enhancement. There is a lobular component
inferiorly measuring up to 3.0 cm which has intrinsic T1 shortening.
No solid or definite septal enhancement of the lesion is seen
following contrast. There is no hydronephrosis. The right kidney
appears normal.

Stomach/Bowel: No evidence of bowel wall thickening, distention or
surrounding inflammatory change.

Vascular/Lymphatic: There are no enlarged abdominal lymph nodes. No
significant vascular findings are present.

Other: No ascites or peritoneal nodularity. The visualized anterior
abdominal wall appears unremarkable.

Musculoskeletal: No acute or significant osseous findings.
IMPRESSION: 1. The lesion of concern in the lower pole of the left kidney is
septated and multi lobulated. The septations are thicker than
hairline, but demonstrate no definite enhancement. The features in
this case are borderline between a Bosniak 2F and 3 lesion, although
the multilobulated nature favors a Bosniak 3 lesion. Urology
consultation recommended.
2. The right kidney appears normal.
3. No adenopathy or hydronephrosis.

## 2018-03-30 ENCOUNTER — Encounter: Payer: Self-pay | Admitting: Sports Medicine

## 2018-03-30 ENCOUNTER — Ambulatory Visit (INDEPENDENT_AMBULATORY_CARE_PROVIDER_SITE_OTHER): Payer: BLUE CROSS/BLUE SHIELD | Admitting: Sports Medicine

## 2018-03-30 ENCOUNTER — Ambulatory Visit (INDEPENDENT_AMBULATORY_CARE_PROVIDER_SITE_OTHER): Payer: BLUE CROSS/BLUE SHIELD

## 2018-03-30 VITALS — BP 113/77 | HR 97 | Temp 98.0°F | Ht 72.0 in | Wt 255.0 lb

## 2018-03-30 DIAGNOSIS — R1032 Left lower quadrant pain: Secondary | ICD-10-CM

## 2018-03-30 DIAGNOSIS — R109 Unspecified abdominal pain: Secondary | ICD-10-CM

## 2018-03-30 DIAGNOSIS — R0989 Other specified symptoms and signs involving the circulatory and respiratory systems: Secondary | ICD-10-CM | POA: Diagnosis not present

## 2018-03-30 DIAGNOSIS — R7989 Other specified abnormal findings of blood chemistry: Secondary | ICD-10-CM

## 2018-03-30 LAB — POCT URINALYSIS DIPSTICK
Bilirubin, UA: NEGATIVE
Blood, UA: NEGATIVE
Glucose, UA: NEGATIVE
Ketones, UA: NEGATIVE
Leukocytes, UA: NEGATIVE
Nitrite, UA: NEGATIVE
Protein, UA: NEGATIVE
Spec Grav, UA: >=1.03 — AB (ref 1.010–1.025)
Urobilinogen, UA: 1 E.U./dL
pH, UA: 5.5 (ref 5.0–8.0)

## 2018-03-30 MED ORDER — SULFAMETHOXAZOLE-TRIMETHOPRIM 800-160 MG PO TABS
1.0000 | ORAL_TABLET | Freq: Two times a day (BID) | ORAL | 0 refills | Status: DC
Start: 2018-03-30 — End: 2018-04-06

## 2018-03-30 MED ORDER — METRONIDAZOLE 500 MG PO TABS: 500.0000 mg | ORAL_TABLET | Freq: Two times a day (BID) | ORAL | 0 refills | Status: DC

## 2018-03-30 NOTE — Assessment & Plan Note (Signed)
Suspect viral gastritis versus diverticulitis. Allergic to penicillins and Cipro so using Bactrim and Flagyl. CBC, CMP, lipase, amylase. Negative urinalysis in the office. Abdominal and chest x-rays. Pain is pleuritic so adding a d-dimer as well.  Return in 1 week, if persistent symptoms or leukocytosis on CBC we will add a CT of the abdomen and pelvis with oral and IV contrast.

## 2018-03-30 NOTE — Progress Notes (Addendum)
Subjective:    CC: Acute abdominal pain  HPI: This is a pleasant 37 year old male with a history of gastritis, renal cell carcinoma post curative wedge resection.  Since then he has had a week of mild headaches, low-grade fevers, and left lower quadrant abdominal pain without nausea, vomiting, diarrhea, mild constipation.  In addition he has noted pleuritic type lower chest pain.  Symptoms are moderate, persistent, localized without radiation.  No melena, hematochezia, skin rashes, no trauma.  I reviewed the past medical history, family history, social history, surgical history, and allergies today and no changes were needed.  Please see the problem list section below in epic for further details.  Past Medical History: Past Medical History:  Diagnosis Date  . Asthma    as a child  . Chest pain, atypical 2012   possible angina per pt- stress test neg  . Chronic kidney disease    renal mass  . Complication of anesthesia    woke up in surgery x2- no recall  . Lumbar radiculopathy, chronic right S1 06/24/2012   Past Surgical History: Past Surgical History:  Procedure Laterality Date  . ROBOTIC ASSITED PARTIAL NEPHRECTOMY Left 04/10/2016   Procedure: XI ROBOTIC ASSITED LEFT  PARTIAL NEPHRECTOMY;  Surgeon: Ardis Hughs, MD;  Location: [facility];  Service: Urology;  Laterality: Left;  Clamp on: 1408 Clamp off: 1420 Total clamp time: 12 minutes  . WISDOM TOOTH EXTRACTION    . WRIST SURGERY     Social History: Social History   Socioeconomic History  . Marital status: Married    Spouse name: Not on file  . Number of children: Not on file  . Years of education: Not on file  . Highest education level: Not on file  Occupational History  . Not on file  Social Needs  . Financial resource strain: Not on file  . Food insecurity:    Worry: Not on file    Inability: Not on file  . Transportation needs:    Medical: Not on file    Non-medical: Not on file  Tobacco Use  . Smoking  status: Former Smoker    Packs/day: 1.00    Years: 10.00    Pack years: 10.00    Types: Cigarettes    Last attempt to quit: 10/06/2010    Years since quitting: 7.4  . Smokeless tobacco: Current User    Types: Snuff, Chew  Substance and Sexual Activity  . Alcohol use: Yes    Alcohol/week: 6.0 oz    Types: 10 Cans of beer per week  . Drug use: No  . Sexual activity: Not on file  Lifestyle  . Physical activity:    Days per week: Not on file    Minutes per session: Not on file  . Stress: Not on file  Relationships  . Social connections:    Talks on phone: Not on file    Gets together: Not on file    Attends religious service: Not on file    Active member of club or organization: Not on file    Attends meetings of clubs or organizations: Not on file    Relationship status: Not on file  Other Topics Concern  . Not on file  Social History Narrative  . Not on file   Family History: Family History  Problem Relation Age of Onset  . Heart attack Mother   . Heart disease Mother   . Heart attack Father   . Heart disease Father   .  Heart disease Maternal Grandmother   . Diabetes Maternal Grandmother   . Heart disease Maternal Grandfather   . COPD Paternal Grandfather   . Heart disease Paternal Grandfather    Allergies: Allergies  Allergen Reactions  . Ciprofloxacin Other (See Comments)    BACK PAIN  . Penicillins Rash   Medications: See med rec.  Review of Systems: No fevers, chills, night sweats, weight loss, chest pain, or shortness of breath.   Objective:    General: Well Developed, well nourished, and in no acute distress.  Neuro: Alert and oriented x3, extra-ocular muscles intact, sensation grossly intact.  HEENT: Normocephalic, atraumatic, pupils equal round reactive to light, neck supple, no masses, no lymphadenopathy, thyroid nonpalpable.  Skin: Warm and dry, no rashes. Cardiac: Regular rate and rhythm, no murmurs rubs or gallops, no lower extremity edema.    Respiratory: Clear to auscultation bilaterally. Not using accessory muscles, speaking in full sentences. Abdomen: Soft, tender to palpation in the left lower quadrant, minimal darting.  No rigidity or rebound tenderness, positive bowel sounds.  Urinalysis is completely negative in the office.  Impression and Recommendations:    Acute abdominal pain in left lower quadrant Suspect viral gastritis versus diverticulitis. Allergic to penicillins and Cipro so using Bactrim and Flagyl. CBC, CMP, lipase, amylase. Negative urinalysis in the office. Abdominal and chest x-rays. Pain is pleuritic so adding a d-dimer as well.  Return in 1 week, if persistent symptoms or leukocytosis on CBC we will add a CT of the abdomen and pelvis with oral and IV contrast.  Elevated d-dimer D-dimer is elevated, considering pleuritic type chest/abdominal pain we are going to proceed with a CT angiogram of the pulmonary arteries, order placed stat, he should come in ASAP to do it. ___________________________________________ Gwen Her. Dianah Field, M.D., ABFM., CAQSM. Primary Care and [place] Instructor of [place] of [facility] of Medicine

## 2018-03-31 ENCOUNTER — Ambulatory Visit (INDEPENDENT_AMBULATORY_CARE_PROVIDER_SITE_OTHER): Payer: BLUE CROSS/BLUE SHIELD

## 2018-03-31 DIAGNOSIS — R7989 Other specified abnormal findings of blood chemistry: Secondary | ICD-10-CM | POA: Diagnosis not present

## 2018-03-31 DIAGNOSIS — R079 Chest pain, unspecified: Secondary | ICD-10-CM | POA: Diagnosis not present

## 2018-03-31 DIAGNOSIS — R161 Splenomegaly, not elsewhere classified: Secondary | ICD-10-CM | POA: Diagnosis not present

## 2018-03-31 LAB — CBC WITH DIFFERENTIAL/PLATELET
Basophils Absolute: 77 cells/uL (ref 0–200)
Basophils Relative: 1 %
Eosinophils Absolute: 8 {cells}/uL — ABNORMAL LOW (ref 15–500)
Eosinophils Relative: 0.1 %
HCT: 41.8 % (ref 38.5–50.0)
Hemoglobin: 14.3 g/dL (ref 13.2–17.1)
Lymphs Abs: 4959 cells/uL — ABNORMAL HIGH (ref 850–3900)
MCH: 28.6 pg (ref 27.0–33.0)
MCHC: 34.2 g/dL (ref 32.0–36.0)
MCV: 83.6 fL (ref 80.0–100.0)
MPV: 9.4 fL (ref 7.5–12.5)
Monocytes Relative: 12.9 %
Neutro Abs: 1663 cells/uL (ref 1500–7800)
Neutrophils Relative %: 21.6 %
Platelets: 191 10*3/uL (ref 140–400)
RBC: 5 10*6/uL (ref 4.20–5.80)
RDW: 12.6 % (ref 11.0–15.0)
Total Lymphocyte: 64.4 %
WBC mixed population: 993 {cells}/uL — ABNORMAL HIGH (ref 200–950)
WBC: 7.7 10*3/uL (ref 3.8–10.8)

## 2018-03-31 LAB — COMPREHENSIVE METABOLIC PANEL WITH GFR
ALT: 37 U/L (ref 9–46)
Albumin: 4.3 g/dL (ref 3.6–5.1)
Alkaline phosphatase (APISO): 53 U/L (ref 40–115)
BUN: 12 mg/dL (ref 7–25)
CO2: 26 mmol/L (ref 20–32)
Calcium: 9.2 mg/dL (ref 8.6–10.3)
Potassium: 4.6 mmol/L (ref 3.5–5.3)
Total Bilirubin: 0.7 mg/dL (ref 0.2–1.2)

## 2018-03-31 LAB — COMPREHENSIVE METABOLIC PANEL
AG Ratio: 1.5 (calc) (ref 1.0–2.5)
AST: 27 U/L (ref 10–40)
Chloride: 104 mmol/L (ref 98–110)
Creat: 1.19 mg/dL (ref 0.60–1.35)
Globulin: 2.9 g/dL (calc) (ref 1.9–3.7)
Glucose, Bld: 98 mg/dL (ref 65–99)
Sodium: 139 mmol/L (ref 135–146)
Total Protein: 7.2 g/dL (ref 6.1–8.1)

## 2018-03-31 LAB — AMYLASE: Amylase: 30 U/L (ref 21–101)

## 2018-03-31 LAB — LIPASE: Lipase: 27 U/L (ref 7–60)

## 2018-03-31 LAB — D-DIMER, QUANTITATIVE: D-Dimer, Quant: 0.57 mcg/mL FEU — ABNORMAL HIGH (ref <0.50)

## 2018-03-31 MED ORDER — IOPAMIDOL (ISOVUE-370) INJECTION 76%
100.0000 mL | Freq: Once | INTRAVENOUS | Status: AC | PRN
  Administered 2018-03-31: 100 mL via INTRAVENOUS

## 2018-03-31 NOTE — Assessment & Plan Note (Signed)
D-dimer is elevated, considering pleuritic type chest/abdominal pain we are going to proceed with a CT angiogram of the pulmonary arteries, order placed stat, he should come in ASAP to do it.

## 2018-03-31 NOTE — Addendum Note (Signed)
Addended by: Silverio Decamp on: 03/31/2018 08:34 AM   Modules accepted: Orders

## 2018-04-06 ENCOUNTER — Ambulatory Visit: Payer: BLUE CROSS/BLUE SHIELD | Admitting: Sports Medicine

## 2018-04-06 ENCOUNTER — Encounter: Payer: Self-pay | Admitting: Sports Medicine

## 2018-04-06 DIAGNOSIS — R1032 Left lower quadrant pain: Secondary | ICD-10-CM | POA: Diagnosis not present

## 2018-04-06 DIAGNOSIS — R221 Localized swelling, mass and lump, neck: Secondary | ICD-10-CM | POA: Diagnosis not present

## 2018-04-06 NOTE — Progress Notes (Signed)
Subjective:    CC: Recheck abdominal pain  HPI: Left lower quadrant abdominal pain: Labs were negative, d-dimer was however elevated so we proceeded with a CT angios the pulmonary arteries considering some pleuritic chest pain, however most of his pain was left lower quadrant, suspicion was for viral enteritis versus diverticulitis, because he was allergic to penicillins and Cipro we did Septra and Flagyl, and pain is 95% resolved now.  No melena, hematochezia.  Over the past day he has developed a small mass at the top of his sternum, nontender.  I reviewed the past medical history, family history, social history, surgical history, and allergies today and no changes were needed.  Please see the problem list section below in epic for further details.  Past Medical History: Past Medical History:  Diagnosis Date  . Asthma    as a child  . Chest pain, atypical 2012   possible angina per pt- stress test neg  . Chronic kidney disease    renal mass  . Complication of anesthesia    woke up in surgery x2- no recall  . Lumbar radiculopathy, chronic right S1 06/24/2012   Past Surgical History: Past Surgical History:  Procedure Laterality Date  . ROBOTIC ASSITED PARTIAL NEPHRECTOMY Left 04/10/2016   Procedure: XI ROBOTIC ASSITED LEFT  PARTIAL NEPHRECTOMY;  Surgeon: Ardis Hughs, MD;  Location: [facility];  Service: Urology;  Laterality: Left;  Clamp on: 1408 Clamp off: 1420 Total clamp time: 12 minutes  . WISDOM TOOTH EXTRACTION    . WRIST SURGERY     Social History: Social History   Socioeconomic History  . Marital status: Married    Spouse name: Not on file  . Number of children: Not on file  . Years of education: Not on file  . Highest education level: Not on file  Occupational History  . Not on file  Social Needs  . Financial resource strain: Not on file  . Food insecurity:    Worry: Not on file    Inability: Not on file  . Transportation needs:    Medical: Not on file      Non-medical: Not on file  Tobacco Use  . Smoking status: Former Smoker    Packs/day: 1.00    Years: 10.00    Pack years: 10.00    Types: Cigarettes    Last attempt to quit: 10/06/2010    Years since quitting: 7.5  . Smokeless tobacco: Current User    Types: Snuff, Chew  Substance and Sexual Activity  . Alcohol use: Yes    Alcohol/week: 6.0 oz    Types: 10 Cans of beer per week  . Drug use: No  . Sexual activity: Not on file  Lifestyle  . Physical activity:    Days per week: Not on file    Minutes per session: Not on file  . Stress: Not on file  Relationships  . Social connections:    Talks on phone: Not on file    Gets together: Not on file    Attends religious service: Not on file    Active member of club or organization: Not on file    Attends meetings of clubs or organizations: Not on file    Relationship status: Not on file  Other Topics Concern  . Not on file  Social History Narrative  . Not on file   Family History: Family History  Problem Relation Age of Onset  . Heart attack Mother   . Heart disease Mother   .  Heart attack Father   . Heart disease Father   . Heart disease Maternal Grandmother   . Diabetes Maternal Grandmother   . Heart disease Maternal Grandfather   . COPD Paternal Grandfather   . Heart disease Paternal Grandfather    Allergies: Allergies  Allergen Reactions  . Ciprofloxacin Other (See Comments)    BACK PAIN  . Penicillins Rash   Medications: See med rec.  Review of Systems: No fevers, chills, night sweats, weight loss, chest pain, or shortness of breath.   Objective:    General: Well Developed, well nourished, and in no acute distress.  Neuro: Alert and oriented x3, extra-ocular muscles intact, sensation grossly intact.  HEENT: Normocephalic, atraumatic, pupils equal round reactive to light, neck supple, no masses, no lymphadenopathy, thyroid nonpalpable.  I was unable to appreciate any cervical, axillary, epitrochlear, or  inguinal lymphadenopathy. Skin: Warm and dry, no rashes.  There is a 1 to 2 cm palpable mass at the jugular notch and it is ill-defined, movable and nontender. Cardiac: Regular rate and rhythm, no murmurs rubs or gallops, no lower extremity edema.  Respiratory: Clear to auscultation bilaterally. Not using accessory muscles, speaking in full sentences.  Impression and Recommendations:    Acute abdominal pain in left lower quadrant Abdominal pain is for the most part resolved, we used Septra and Flagyl, he is allergic to penicillins and Cipro. Return as needed for this.  Mass in neck New mass, popped up overnight, jugular notch above the sternum, it feels to be subcutaneous but ill-defined, movable. Adding ultrasound.  ___________________________________________ Gwen Her. Dianah Field, M.D., ABFM., CAQSM. Primary Care and [place] Instructor of [place] of [facility] of Medicine

## 2018-04-06 NOTE — Assessment & Plan Note (Signed)
New mass, popped up overnight, jugular notch above the sternum, it feels to be subcutaneous but ill-defined, movable. Adding ultrasound.

## 2018-04-06 NOTE — Assessment & Plan Note (Signed)
Abdominal pain is for the most part resolved, we used Septra and Flagyl, he is allergic to penicillins and Cipro. Return as needed for this.

## 2018-04-08 ENCOUNTER — Ambulatory Visit (INDEPENDENT_AMBULATORY_CARE_PROVIDER_SITE_OTHER): Payer: BLUE CROSS/BLUE SHIELD

## 2018-04-08 DIAGNOSIS — R221 Localized swelling, mass and lump, neck: Secondary | ICD-10-CM | POA: Diagnosis not present

## 2018-04-13 DIAGNOSIS — C642 Malignant neoplasm of left kidney, except renal pelvis: Secondary | ICD-10-CM | POA: Diagnosis not present

## 2018-04-13 DIAGNOSIS — K5730 Diverticulosis of large intestine without perforation or abscess without bleeding: Secondary | ICD-10-CM | POA: Diagnosis not present

## 2018-11-02 ENCOUNTER — Ambulatory Visit: Payer: BLUE CROSS/BLUE SHIELD | Admitting: Sports Medicine

## 2018-11-02 DIAGNOSIS — E6609 Other obesity due to excess calories: Secondary | ICD-10-CM

## 2018-11-02 DIAGNOSIS — R221 Localized swelling, mass and lump, neck: Secondary | ICD-10-CM

## 2018-11-02 DIAGNOSIS — R1013 Epigastric pain: Secondary | ICD-10-CM | POA: Diagnosis not present

## 2018-11-02 MED ORDER — PHENTERMINE-TOPIRAMATE ER 7.5-46 MG PO CP24: 1.0000 | ORAL_CAPSULE | Freq: Every morning | ORAL | 0 refills | Status: DC

## 2018-11-02 MED ORDER — SUCRALFATE 1 G PO TABS: 1.0000 g | ORAL_TABLET | Freq: Four times a day (QID) | ORAL | 0 refills | Status: DC

## 2018-11-02 MED ORDER — PHENTERMINE-TOPIRAMATE ER 3.75-23 MG PO CP24: 1.0000 | ORAL_CAPSULE | Freq: Every morning | ORAL | 3 refills | Status: DC

## 2018-11-02 MED ORDER — PANTOPRAZOLE SODIUM 40 MG PO TBEC: 40.0000 mg | DELAYED_RELEASE_TABLET | Freq: Two times a day (BID) | ORAL | 3 refills | Status: DC

## 2018-11-02 NOTE — Progress Notes (Signed)
Subjective:    CC: Acute abdominal pain  HPI: 1 none for the past couple of weeks this pleasant 38 year old male with a history of renal cell carcinoma post partial nephrectomy has had epigastric pain, belching, a sensation of excessive fullness.  No melena, hematochezia, hematemesis.  No nausea, vomiting, diarrhea.  He had a mass in the supraclavicular notch of his sternum at the last visit, this was noted to be a lymph node on ultrasound, this has since resolved on its own.  Obesity: Would like to start weight loss treatment again.  I reviewed the past medical history, family history, social history, surgical history, and allergies today and no changes were needed.  Please see the problem list section below in epic for further details.  Past Medical History: Past Medical History:  Diagnosis Date  . Asthma    as a child  . Chest pain, atypical 2012   possible angina per pt- stress test neg  . Chronic kidney disease    renal mass  . Complication of anesthesia    woke up in surgery x2- no recall  . Lumbar radiculopathy, chronic right S1 06/24/2012   Past Surgical History: Past Surgical History:  Procedure Laterality Date  . ROBOTIC ASSITED PARTIAL NEPHRECTOMY Left 04/10/2016   Procedure: XI ROBOTIC ASSITED LEFT  PARTIAL NEPHRECTOMY;  Surgeon: Ardis Hughs, MD;  Location: [facility];  Service: Urology;  Laterality: Left;  Clamp on: 1408 Clamp off: 1420 Total clamp time: 12 minutes  . WISDOM TOOTH EXTRACTION    . WRIST SURGERY     Social History: Social History   Socioeconomic History  . Marital status: Married    Spouse name: Not on file  . Number of children: Not on file  . Years of education: Not on file  . Highest education level: Not on file  Occupational History  . Not on file  Social Needs  . Financial resource strain: Not on file  . Food insecurity:    Worry: Not on file    Inability: Not on file  . Transportation needs:    Medical: Not on file   Non-medical: Not on file  Tobacco Use  . Smoking status: Former Smoker    Packs/day: 1.00    Years: 10.00    Pack years: 10.00    Types: Cigarettes    Last attempt to quit: 10/06/2010    Years since quitting: 8.0  . Smokeless tobacco: Current User    Types: Snuff, Chew  Substance and Sexual Activity  . Alcohol use: Yes    Alcohol/week: 10.0 standard drinks    Types: 10 Cans of beer per week  . Drug use: No  . Sexual activity: Not on file  Lifestyle  . Physical activity:    Days per week: Not on file    Minutes per session: Not on file  . Stress: Not on file  Relationships  . Social connections:    Talks on phone: Not on file    Gets together: Not on file    Attends religious service: Not on file    Active member of club or organization: Not on file    Attends meetings of clubs or organizations: Not on file    Relationship status: Not on file  Other Topics Concern  . Not on file  Social History Narrative  . Not on file   Family History: Family History  Problem Relation Age of Onset  . Heart attack Mother   . Heart disease Mother   .  Heart attack Father   . Heart disease Father   . Heart disease Maternal Grandmother   . Diabetes Maternal Grandmother   . Heart disease Maternal Grandfather   . COPD Paternal Grandfather   . Heart disease Paternal Grandfather    Allergies: Allergies  Allergen Reactions  . Ciprofloxacin Other (See Comments)    BACK PAIN  . Penicillins Rash   Medications: See med rec.  Review of Systems: No fevers, chills, night sweats, weight loss, chest pain, or shortness of breath.   Objective:    General: Well Developed, well nourished, and in no acute distress.  Neuro: Alert and oriented x3, extra-ocular muscles intact, sensation grossly intact.  HEENT: Normocephalic, atraumatic, pupils equal round reactive to light, neck supple, no masses, no lymphadenopathy, thyroid nonpalpable.  Skin: Warm and dry, no rashes. Cardiac: Regular rate and  rhythm, no murmurs rubs or gallops, no lower extremity edema.  Respiratory: Clear to auscultation bilaterally. Not using accessory muscles, speaking in full sentences. Abdomen: Soft, tender to palpation in the epigastrium, nondistended, normal bowel sounds, no palpable masses, only minimal guarding in the epigastric region, no rigidity, no rebound tenderness.  H. pylori breath test performed today.  Impression and Recommendations:    Acute epigastric pain Dyspepsia, PUD versus gastritis. Urease breath test today. Adding pantoprazole 40 twice a day, Carafate. Checking labs. No melena, hematochezia, hematemesis. Return to see me in 2 weeks.  Mass in neck Appear to be a suprasternal lymph node on ultrasound. This has since resolved.  Obesity Starting Qsymia ___________________________________________ Gwen Her. Dianah Field, M.D., ABFM., CAQSM. Primary Care and Sports Medicine [facility]  Adjunct Professor of [place] of [facility] of Medicine

## 2018-11-02 NOTE — Assessment & Plan Note (Signed)
Dyspepsia, PUD versus gastritis. Urease breath test today. Adding pantoprazole 40 twice a day, Carafate. Checking labs. No melena, hematochezia, hematemesis. Return to see me in 2 weeks.

## 2018-11-02 NOTE — Assessment & Plan Note (Signed)
Starting Qsymia

## 2018-11-02 NOTE — Assessment & Plan Note (Signed)
Appear to be a suprasternal lymph node on ultrasound. This has since resolved.

## 2018-11-03 LAB — COMPREHENSIVE METABOLIC PANEL
AST: 18 U/L (ref 10–40)
Albumin: 4.4 g/dL (ref 3.6–5.1)
Alkaline phosphatase (APISO): 52 U/L (ref 36–130)
CO2: 25 mmol/L (ref 20–32)
Calcium: 9.5 mg/dL (ref 8.6–10.3)
Chloride: 104 mmol/L (ref 98–110)
Globulin: 2.7 g/dL (calc) (ref 1.9–3.7)
Potassium: 4.1 mmol/L (ref 3.5–5.3)
Total Bilirubin: 0.5 mg/dL (ref 0.2–1.2)
Total Protein: 7.1 g/dL (ref 6.1–8.1)

## 2018-11-03 LAB — COMPREHENSIVE METABOLIC PANEL WITH GFR
AG Ratio: 1.6 (calc) (ref 1.0–2.5)
ALT: 26 U/L (ref 9–46)
BUN: 11 mg/dL (ref 7–25)
Creat: 0.95 mg/dL (ref 0.60–1.35)
Glucose, Bld: 97 mg/dL (ref 65–99)
Sodium: 138 mmol/L (ref 135–146)

## 2018-11-03 LAB — CBC WITH DIFFERENTIAL/PLATELET
Absolute Monocytes: 1050 {cells}/uL — ABNORMAL HIGH (ref 200–950)
Basophils Absolute: 27 cells/uL (ref 0–200)
Basophils Relative: 0.3 %
Eosinophils Absolute: 89 cells/uL (ref 15–500)
Eosinophils Relative: 1 %
HCT: 45.9 % (ref 38.5–50.0)
Hemoglobin: 15.8 g/dL (ref 13.2–17.1)
Lymphs Abs: 1219 {cells}/uL (ref 850–3900)
MCH: 29.6 pg (ref 27.0–33.0)
MCHC: 34.4 g/dL (ref 32.0–36.0)
MCV: 86.1 fL (ref 80.0–100.0)
MPV: 10.2 fL (ref 7.5–12.5)
Monocytes Relative: 11.8 %
Neutro Abs: 6515 cells/uL (ref 1500–7800)
Neutrophils Relative %: 73.2 %
Platelets: 226 Thousand/uL (ref 140–400)
RBC: 5.33 Million/uL (ref 4.20–5.80)
RDW: 12.4 % (ref 11.0–15.0)
Total Lymphocyte: 13.7 %
WBC: 8.9 Thousand/uL (ref 3.8–10.8)

## 2018-11-03 LAB — AMYLASE: Amylase: 25 U/L (ref 21–101)

## 2018-11-03 LAB — LIPASE: Lipase: 24 U/L (ref 7–60)

## 2018-11-03 LAB — H. PYLORI BREATH TEST: H. pylori Breath Test: NOT DETECTED

## 2018-11-05 ENCOUNTER — Other Ambulatory Visit: Payer: Self-pay | Admitting: Sports Medicine

## 2018-11-05 DIAGNOSIS — E6609 Other obesity due to excess calories: Secondary | ICD-10-CM

## 2018-11-05 MED ORDER — PHENTERMINE-TOPIRAMATE ER 3.75-23 MG PO CP24: 1.0000 | ORAL_CAPSULE | Freq: Every morning | ORAL | 0 refills | Status: DC

## 2018-11-05 MED ORDER — PHENTERMINE-TOPIRAMATE ER 7.5-46 MG PO CP24
1.0000 | ORAL_CAPSULE | Freq: Every morning | ORAL | 0 refills | Status: DC
Start: 2018-11-05 — End: 2020-07-28

## 2018-11-16 ENCOUNTER — Encounter: Payer: Self-pay | Admitting: Sports Medicine

## 2018-11-16 ENCOUNTER — Ambulatory Visit: Payer: BLUE CROSS/BLUE SHIELD | Admitting: Sports Medicine

## 2018-11-16 DIAGNOSIS — R1013 Epigastric pain: Secondary | ICD-10-CM

## 2018-11-16 NOTE — Assessment & Plan Note (Signed)
Normal labs, negative H. pylori test. Resolved now with pantoprazole and Carafate. He may wean off of these now.

## 2018-11-16 NOTE — Progress Notes (Signed)
Subjective:    CC: Follow-up  HPI: Michael Rubio returns, now treated for acute epigastric pain.  He has done extremely well with Carafate and pantoprazole.  No melena, hematochezia, hematemesis.  H. pylori testing and other lab testing including amylase and lipase are normal/negative.  I reviewed the past medical history, family history, social history, surgical history, and allergies today and no changes were needed.  Please see the problem list section below in epic for further details.  Past Medical History: Past Medical History:  Diagnosis Date  . Asthma    as a child  . Chest pain, atypical 2012   possible angina per pt- stress test neg  . Chronic kidney disease    renal mass  . Complication of anesthesia    woke up in surgery x2- no recall  . Lumbar radiculopathy, chronic right S1 06/24/2012   Past Surgical History: Past Surgical History:  Procedure Laterality Date  . ROBOTIC ASSITED PARTIAL NEPHRECTOMY Left 04/10/2016   Procedure: XI ROBOTIC ASSITED LEFT  PARTIAL NEPHRECTOMY;  Surgeon: Ardis Hughs, MD;  Location: [facility];  Service: Urology;  Laterality: Left;  Clamp on: 1408 Clamp off: 1420 Total clamp time: 12 minutes  . WISDOM TOOTH EXTRACTION    . WRIST SURGERY     Social History: Social History   Socioeconomic History  . Marital status: Married    Spouse name: Not on file  . Number of children: Not on file  . Years of education: Not on file  . Highest education level: Not on file  Occupational History  . Not on file  Social Needs  . Financial resource strain: Not on file  . Food insecurity:    Worry: Not on file    Inability: Not on file  . Transportation needs:    Medical: Not on file    Non-medical: Not on file  Tobacco Use  . Smoking status: Former Smoker    Packs/day: 1.00    Years: 10.00    Pack years: 10.00    Types: Cigarettes    Last attempt to quit: 10/06/2010    Years since quitting: 8.1  . Smokeless tobacco: Current User    Types:  Snuff, Chew  Substance and Sexual Activity  . Alcohol use: Yes    Alcohol/week: 10.0 standard drinks    Types: 10 Cans of beer per week  . Drug use: No  . Sexual activity: Not on file  Lifestyle  . Physical activity:    Days per week: Not on file    Minutes per session: Not on file  . Stress: Not on file  Relationships  . Social connections:    Talks on phone: Not on file    Gets together: Not on file    Attends religious service: Not on file    Active member of club or organization: Not on file    Attends meetings of clubs or organizations: Not on file    Relationship status: Not on file  Other Topics Concern  . Not on file  Social History Narrative  . Not on file   Family History: Family History  Problem Relation Age of Onset  . Heart attack Mother   . Heart disease Mother   . Heart attack Father   . Heart disease Father   . Heart disease Maternal Grandmother   . Diabetes Maternal Grandmother   . Heart disease Maternal Grandfather   . COPD Paternal Grandfather   . Heart disease Paternal Grandfather    Allergies:  Allergies  Allergen Reactions  . Ciprofloxacin Other (See Comments)    BACK PAIN  . Penicillins Rash   Medications: See med rec.  Review of Systems: No fevers, chills, night sweats, weight loss, chest pain, or shortness of breath.   Objective:    General: Well Developed, well nourished, and in no acute distress.  Neuro: Alert and oriented x3, extra-ocular muscles intact, sensation grossly intact.  HEENT: Normocephalic, atraumatic, pupils equal round reactive to light, neck supple, no masses, no lymphadenopathy, thyroid nonpalpable.  Skin: Warm and dry, no rashes. Cardiac: Regular rate and rhythm, no murmurs rubs or gallops, no lower extremity edema.  Respiratory: Clear to auscultation bilaterally. Not using accessory muscles, speaking in full sentences. Abdomen: Soft, nontender, nondistended, bowel sounds, no palpable masses, no guarding, rigidity,  rebound tenderness.  Impression and Recommendations:    Acute epigastric pain Normal labs, negative H. pylori test. Resolved now with pantoprazole and Carafate. He may wean off of these now.  ___________________________________________ Gwen Her. Dianah Field, M.D., ABFM., CAQSM. Primary Care and Sports Medicine [facility]  Adjunct Professor of [place] of [facility] of Medicine

## 2020-07-28 ENCOUNTER — Ambulatory Visit (INDEPENDENT_AMBULATORY_CARE_PROVIDER_SITE_OTHER): Payer: 59 | Admitting: Sports Medicine

## 2020-07-28 ENCOUNTER — Encounter: Payer: Self-pay | Admitting: Sports Medicine

## 2020-07-28 ENCOUNTER — Other Ambulatory Visit: Payer: Self-pay

## 2020-07-28 VITALS — BP 119/78 | HR 69 | Ht 72.0 in | Wt 244.0 lb

## 2020-07-28 DIAGNOSIS — Z0000 Encounter for general adult medical examination without abnormal findings: Secondary | ICD-10-CM | POA: Diagnosis not present

## 2020-07-28 DIAGNOSIS — Z20822 Contact with and (suspected) exposure to covid-19: Secondary | ICD-10-CM | POA: Diagnosis not present

## 2020-07-28 DIAGNOSIS — N529 Male erectile dysfunction, unspecified: Secondary | ICD-10-CM | POA: Diagnosis not present

## 2020-07-28 MED ORDER — TADALAFIL 5 MG PO TABS: 5.0000 mg | ORAL_TABLET | Freq: Every day | ORAL | 11 refills | Status: DC | PRN

## 2020-07-28 NOTE — Assessment & Plan Note (Signed)
Annual physical exam as above, checking routine labs.

## 2020-07-28 NOTE — Assessment & Plan Note (Signed)
We had a long discussion regarding the pathophysiology and anatomy involved in erectile dysfunction as well as the available treatments. Adding Cialis 5 daily, return in 3 months, we can certainly up the dose prior if needed.

## 2020-07-28 NOTE — Progress Notes (Signed)
Subjective:    CC: Annual Physical Exam  HPI:  This patient is here for their annual physical  I reviewed the past medical history, family history, social history, surgical history, and allergies today and no changes were needed.  Please see the problem list section below in epic for further details.  Past Medical History: Past Medical History:  Diagnosis Date  . Asthma    as a child  . Chest pain, atypical 2012   possible angina per pt- stress test neg  . Chronic kidney disease    renal mass  . Complication of anesthesia    woke up in surgery x2- no recall  . Lumbar radiculopathy, chronic right S1 06/24/2012   Past Surgical History: Past Surgical History:  Procedure Laterality Date  . ROBOTIC ASSITED PARTIAL NEPHRECTOMY Left 04/10/2016   Procedure: XI ROBOTIC ASSITED LEFT  PARTIAL NEPHRECTOMY;  Surgeon: Ardis Hughs, MD;  Location: [facility];  Service: Urology;  Laterality: Left;  Clamp on: 1408 Clamp off: 1420 Total clamp time: 12 minutes  . WISDOM TOOTH EXTRACTION    . WRIST SURGERY     Social History: Social History   Socioeconomic History  . Marital status: Married    Spouse name: Not on file  . Number of children: Not on file  . Years of education: Not on file  . Highest education level: Not on file  Occupational History  . Not on file  Tobacco Use  . Smoking status: Former Smoker    Packs/day: 1.00    Years: 10.00    Pack years: 10.00    Types: Cigarettes    Quit date: 10/06/2010    Years since quitting: 9.8  . Smokeless tobacco: Current User    Types: Snuff, Chew  Substance and Sexual Activity  . Alcohol use: Yes    Alcohol/week: 10.0 standard drinks    Types: 10 Cans of beer per week  . Drug use: No  . Sexual activity: Not on file  Other Topics Concern  . Not on file  Social History Narrative  . Not on file   Social Determinants of Health   Financial Resource Strain:   . Difficulty of Paying Living Expenses: Not on file  Food Insecurity:    . Worried About Charity fundraiser in the Last Year: Not on file  . Ran Out of Food in the Last Year: Not on file  Transportation Needs:   . Lack of Transportation (Medical): Not on file  . Lack of Transportation (Non-Medical): Not on file  Physical Activity:   . Days of Exercise per Week: Not on file  . Minutes of Exercise per Session: Not on file  Stress:   . Feeling of Stress : Not on file  Social Connections:   . Frequency of Communication with Friends and Family: Not on file  . Frequency of Social Gatherings with Friends and Family: Not on file  . Attends Religious Services: Not on file  . Active Member of Clubs or Organizations: Not on file  . Attends Archivist Meetings: Not on file  . Marital Status: Not on file   Family History: Family History  Problem Relation Age of Onset  . Heart attack Mother   . Heart disease Mother   . Heart attack Father   . Heart disease Father   . Heart disease Maternal Grandmother   . Diabetes Maternal Grandmother   . Heart disease Maternal Grandfather   . COPD Paternal Grandfather   .  Heart disease Paternal Grandfather    Allergies: Allergies  Allergen Reactions  . Ciprofloxacin Other (See Comments)    BACK PAIN  . Penicillins Rash   Medications: See med rec.  Review of Systems: No headache, visual changes, nausea, vomiting, diarrhea, constipation, dizziness, abdominal pain, skin rash, fevers, chills, night sweats, swollen lymph nodes, weight loss, chest pain, body aches, joint swelling, muscle aches, shortness of breath, mood changes, visual or auditory hallucinations.  Objective:    General: Well Developed, well nourished, and in no acute distress.  Neuro: Alert and oriented x3, extra-ocular muscles intact, sensation grossly intact. Cranial nerves II through XII are intact, motor, sensory, and coordinative functions are all intact. HEENT: Normocephalic, atraumatic, pupils equal round reactive to light, neck supple, no  masses, no lymphadenopathy, thyroid nonpalpable. Oropharynx, nasopharynx, external ear canals are unremarkable. Skin: Warm and dry, no rashes noted.  Cardiac: Regular rate and rhythm, no murmurs rubs or gallops.  Respiratory: Clear to auscultation bilaterally. Not using accessory muscles, speaking in full sentences.  Abdominal: Soft, nontender, nondistended, positive bowel sounds, no masses, no organomegaly.  Musculoskeletal: Shoulder, elbow, wrist, hip, knee, ankle stable, and with full range of motion.  Impression and Recommendations:    The patient was counselled, risk factors were discussed, anticipatory guidance given.  I spent 30 minutes of total time managing this patient today, this includes chart review, face to face, and non-face to face time, this was specifically spent discussing erectile dysfunction as well as COVID-19, vaccines, and is completely separate from the physical exam.  Annual physical exam Annual physical exam as above, checking routine labs.  Erectile dysfunction We had a long discussion regarding the pathophysiology and anatomy involved in erectile dysfunction as well as the available treatments. Adding Cialis 5 daily, return in 3 months, we can certainly up the dose prior if needed.   ___________________________________________ Gwen Her. Dianah Field, M.D., ABFM., CAQSM. Primary Care and Sports Medicine [facility]  Adjunct Professor of [employer] [facility] [employer]

## 2020-08-01 LAB — COMPREHENSIVE METABOLIC PANEL
AST: 21 U/L (ref 10–40)
Glucose, Bld: 94 mg/dL (ref 65–99)

## 2020-08-02 LAB — COMPREHENSIVE METABOLIC PANEL
ALT: 42 U/L (ref 9–46)
Albumin: 4.5 g/dL (ref 3.6–5.1)
Alkaline phosphatase (APISO): 46 U/L (ref 36–130)
CO2: 27 mmol/L (ref 20–32)
Calcium: 9.4 mg/dL (ref 8.6–10.3)
Chloride: 104 mmol/L (ref 98–110)
Creat: 0.99 mg/dL (ref 0.60–1.35)
Total Protein: 6.8 g/dL (ref 6.1–8.1)

## 2020-08-02 LAB — CBC
HCT: 44.9 % (ref 38.5–50.0)
Hemoglobin: 15.6 g/dL (ref 13.2–17.1)
MCH: 30.2 pg (ref 27.0–33.0)
MCHC: 34.7 g/dL (ref 32.0–36.0)
MCV: 86.8 fL (ref 80.0–100.0)
MPV: 10.4 fL (ref 7.5–12.5)
Platelets: 230 10*3/uL (ref 140–400)
RBC: 5.17 Million/uL (ref 4.20–5.80)
RDW: 12.6 % (ref 11.0–15.0)
WBC: 5.6 10*3/uL (ref 3.8–10.8)

## 2020-08-02 LAB — COMPREHENSIVE METABOLIC PANEL WITH GFR
AG Ratio: 2 (calc) (ref 1.0–2.5)
BUN: 13 mg/dL (ref 7–25)
Globulin: 2.3 g/dL (ref 1.9–3.7)
Potassium: 4.4 mmol/L (ref 3.5–5.3)
Sodium: 137 mmol/L (ref 135–146)
Total Bilirubin: 0.6 mg/dL (ref 0.2–1.2)

## 2020-08-02 LAB — LIPID PANEL
Cholesterol: 173 mg/dL (ref <200)
HDL: 50 mg/dL (ref >=40)
LDL Cholesterol (Calc): 103 mg/dL (calc) — ABNORMAL HIGH
Non-HDL Cholesterol (Calc): 123 mg/dL (ref <130)
Total CHOL/HDL Ratio: 3.5 (calc) (ref <5.0)
Triglycerides: 104 mg/dL (ref <150)

## 2020-08-02 LAB — SARS COV-2 SEROLOGY(COVID-19)AB(IGG,IGM),IMMUNOASSAY
SARS CoV-2 AB IgG: NEGATIVE
SARS CoV-2 IgM: NEGATIVE

## 2020-08-02 LAB — TSH: TSH: 0.89 m[IU]/L (ref 0.40–4.50)

## 2020-08-28 ENCOUNTER — Other Ambulatory Visit: Payer: Self-pay | Admitting: Sports Medicine

## 2020-08-28 DIAGNOSIS — Z3009 Encounter for other general counseling and advice on contraception: Secondary | ICD-10-CM | POA: Insufficient documentation

## 2020-08-28 NOTE — Assessment & Plan Note (Signed)
Referral for vasectomy

## 2020-10-30 ENCOUNTER — Ambulatory Visit: Payer: 59 | Admitting: Sports Medicine

## 2020-11-03 ENCOUNTER — Ambulatory Visit: Payer: 59 | Admitting: Sports Medicine

## 2021-08-12 ENCOUNTER — Other Ambulatory Visit: Payer: Self-pay | Admitting: Sports Medicine

## 2021-08-12 DIAGNOSIS — N529 Male erectile dysfunction, unspecified: Secondary | ICD-10-CM

## 2021-10-08 ENCOUNTER — Ambulatory Visit (INDEPENDENT_AMBULATORY_CARE_PROVIDER_SITE_OTHER): Payer: 59 | Admitting: Sports Medicine

## 2021-10-08 ENCOUNTER — Other Ambulatory Visit: Payer: Self-pay

## 2021-10-08 VITALS — BP 132/90 | HR 66 | Wt 256.4 lb

## 2021-10-08 DIAGNOSIS — R739 Hyperglycemia, unspecified: Secondary | ICD-10-CM | POA: Diagnosis not present

## 2021-10-08 DIAGNOSIS — R030 Elevated blood-pressure reading, without diagnosis of hypertension: Secondary | ICD-10-CM | POA: Diagnosis not present

## 2021-10-08 DIAGNOSIS — E291 Testicular hypofunction: Secondary | ICD-10-CM

## 2021-10-08 DIAGNOSIS — E6609 Other obesity due to excess calories: Secondary | ICD-10-CM

## 2021-10-08 DIAGNOSIS — Z0000 Encounter for general adult medical examination without abnormal findings: Secondary | ICD-10-CM | POA: Diagnosis not present

## 2021-10-08 MED ORDER — PHENTERMINE HCL 37.5 MG PO TABS: ORAL_TABLET | ORAL | 0 refills | Status: DC

## 2021-10-08 NOTE — Assessment & Plan Note (Signed)
Annual physical as above. Checking routine labs. Up-to-date on screening measures and vaccines. Declines flu shot.

## 2021-10-08 NOTE — Progress Notes (Signed)
Subjective:    CC: Annual Physical Exam  HPI:  This patient is here for their annual physical  I reviewed the past medical history, family history, social history, surgical history, and allergies today and no changes were needed.  Please see the problem list section below in epic for further details.  Past Medical History: Past Medical History:  Diagnosis Date   Asthma    as a child   Chest pain, atypical 2012   possible angina per pt- stress test neg   Chronic kidney disease    renal mass   Complication of anesthesia    woke up in surgery x2- no recall   Lumbar radiculopathy, chronic right S1 06/24/2012   Past Surgical History: Past Surgical History:  Procedure Laterality Date   ROBOTIC ASSITED PARTIAL NEPHRECTOMY Left 04/10/2016   Procedure: XI ROBOTIC ASSITED LEFT  PARTIAL NEPHRECTOMY;  Surgeon: Ardis Hughs, MD;  Location: [facility];  Service: Urology;  Laterality: Left;  Clamp on: 1408 Clamp off: 1420 Total clamp time: 12 minutes   WISDOM TOOTH EXTRACTION     WRIST SURGERY     Social History: Social History   Socioeconomic History   Marital status: Married    Spouse name: Not on file   Number of children: Not on file   Years of education: Not on file   Highest education level: Not on file  Occupational History   Not on file  Tobacco Use   Smoking status: Former    Packs/day: 1.00    Years: 10.00    Pack years: 10.00    Types: Cigarettes    Quit date: 10/06/2010    Years since quitting: 11.0   Smokeless tobacco: Current    Types: Snuff, Chew  Substance and Sexual Activity   Alcohol use: Yes    Alcohol/week: 10.0 standard drinks    Types: 10 Cans of beer per week   Drug use: No   Sexual activity: Not on file  Other Topics Concern   Not on file  Social History Narrative   Not on file   Social Determinants of Health   Financial Resource Strain: Not on file  Food Insecurity: Not on file  Transportation Needs: Not on file  Physical Activity: Not  on file  Stress: Not on file  Social Connections: Not on file   Family History: Family History  Problem Relation Age of Onset   Heart attack Mother    Heart disease Mother    Heart attack Father    Heart disease Father    Heart disease Maternal Grandmother    Diabetes Maternal Grandmother    Heart disease Maternal Grandfather    COPD Paternal Grandfather    Heart disease Paternal Grandfather    Allergies: Allergies  Allergen Reactions   Ciprofloxacin Other (See Comments)    BACK PAIN   Penicillins Rash   Medications: See med rec.  Review of Systems: No headache, visual changes, nausea, vomiting, diarrhea, constipation, dizziness, abdominal pain, skin rash, fevers, chills, night sweats, swollen lymph nodes, weight loss, chest pain, body aches, joint swelling, muscle aches, shortness of breath, mood changes, visual or auditory hallucinations.  Objective:    General: Well Developed, well nourished, and in no acute distress.  Neuro: Alert and oriented x3, extra-ocular muscles intact, sensation grossly intact. Cranial nerves II through XII are intact, motor, sensory, and coordinative functions are all intact. HEENT: Normocephalic, atraumatic, pupils equal round reactive to light, neck supple, no masses, no lymphadenopathy, thyroid nonpalpable. Oropharynx,  nasopharynx, external ear canals are unremarkable. Skin: Warm and dry, no rashes noted.  Cardiac: Regular rate and rhythm, no murmurs rubs or gallops.  Respiratory: Clear to auscultation bilaterally. Not using accessory muscles, speaking in full sentences.  Abdominal: Soft, nontender, nondistended, positive bowel sounds, no masses, no organomegaly.  Musculoskeletal: Shoulder, elbow, wrist, hip, knee, ankle stable, and with full range of motion.  Impression and Recommendations:    The patient was counselled, risk factors were discussed, anticipatory guidance given.  Annual physical exam Annual physical as above. Checking  routine labs. Up-to-date on screening measures and vaccines. Declines flu shot.  Obesity Did really well with phentermine historically, he would like to restart, and also help with motivation. Return monthly for weight checks and refills and when we are finished with 6 months we will probably drop to a half tab long-term. Michael Rubio was complaining of a decreased need for sleep, this sounds fairly chronic for him and it does not come and go, no other symptoms of mania. He could improve his sleep hygiene with decreased screen time, gets to bed around 10, sleep latency on and off can last several hours, he gets up at about 7 to 7:30 in the morning and feels well rested and functional. We will simply watch this for now. He does understand that we need to keep a close eye out for phentermine worsening the symptoms.  Elevated blood pressure reading in office without diagnosis of hypertension Blood pressure borderline, this will improve as he loses significant weight.   ___________________________________________ Gwen Her. Dianah Field, M.D., ABFM., CAQSM. Primary Care and Sports Medicine [facility]  Adjunct Professor of [place] [employer] [facility] of Medicine

## 2021-10-08 NOTE — Assessment & Plan Note (Signed)
Did really well with phentermine historically, he would like to restart, and also help with motivation. Return monthly for weight checks and refills and when we are finished with 6 months we will probably drop to a half tab long-term. Flavius was complaining of a decreased need for sleep, this sounds fairly chronic for him and it does not come and go, no other symptoms of mania. He could improve his sleep hygiene with decreased screen time, gets to bed around 10, sleep latency on and off can last several hours, he gets up at about 7 to 7:30 in the morning and feels well rested and functional. We will simply watch this for now. He does understand that we need to keep a close eye out for phentermine worsening the symptoms.

## 2021-10-08 NOTE — Assessment & Plan Note (Signed)
Blood pressure borderline, this will improve as he loses significant weight.

## 2021-10-12 LAB — CBC
HCT: 46.4 % (ref 38.5–50.0)
Hemoglobin: 16 g/dL (ref 13.2–17.1)
MCH: 29.6 pg (ref 27.0–33.0)
MCHC: 34.5 g/dL (ref 32.0–36.0)
MCV: 85.8 fL (ref 80.0–100.0)
MPV: 10.2 fL (ref 7.5–12.5)
Platelets: 227 10*3/uL (ref 140–400)
RBC: 5.41 10*6/uL (ref 4.20–5.80)
RDW: 12.5 % (ref 11.0–15.0)
WBC: 5.7 10*3/uL (ref 3.8–10.8)

## 2021-10-12 LAB — TESTOSTERONE, FREE & TOTAL
Free Testosterone: 94.7 pg/mL (ref 35.0–155.0)
Testosterone, Total, LC-MS-MS: 659 ng/dL (ref 250–1100)

## 2021-10-12 LAB — LIPID PANEL
Cholesterol: 177 mg/dL (ref <200)
HDL: 54 mg/dL (ref >=40)
LDL Cholesterol (Calc): 102 mg/dL (calc) — ABNORMAL HIGH
Non-HDL Cholesterol (Calc): 123 mg/dL (calc) (ref <130)
Total CHOL/HDL Ratio: 3.3 (calc) (ref <5.0)
Triglycerides: 114 mg/dL (ref <150)

## 2021-10-12 LAB — HEMOGLOBIN A1C
Hgb A1c MFr Bld: 5.1 % of total Hgb (ref <5.7)
Mean Plasma Glucose: 100 mg/dL
eAG (mmol/L): 5.5 mmol/L

## 2021-10-12 LAB — COMPREHENSIVE METABOLIC PANEL
AG Ratio: 1.6 (calc) (ref 1.0–2.5)
ALT: 37 U/L (ref 9–46)
AST: 21 U/L (ref 10–40)
Albumin: 4.4 g/dL (ref 3.6–5.1)
Alkaline phosphatase (APISO): 49 U/L (ref 36–130)
BUN: 15 mg/dL (ref 7–25)
CO2: 31 mmol/L (ref 20–32)
Calcium: 9.5 mg/dL (ref 8.6–10.3)
Chloride: 105 mmol/L (ref 98–110)
Creat: 1.06 mg/dL (ref 0.60–1.29)
Globulin: 2.7 g/dL (calc) (ref 1.9–3.7)
Glucose, Bld: 93 mg/dL (ref 65–99)
Potassium: 4.4 mmol/L (ref 3.5–5.3)
Sodium: 140 mmol/L (ref 135–146)
Total Bilirubin: 0.7 mg/dL (ref 0.2–1.2)
Total Protein: 7.1 g/dL (ref 6.1–8.1)

## 2021-10-12 LAB — TSH: TSH: 0.94 mIU/L (ref 0.40–4.50)

## 2021-10-19 ENCOUNTER — Other Ambulatory Visit: Payer: Self-pay | Admitting: Sports Medicine

## 2021-10-19 DIAGNOSIS — N529 Male erectile dysfunction, unspecified: Secondary | ICD-10-CM

## 2021-11-05 ENCOUNTER — Other Ambulatory Visit: Payer: Self-pay

## 2021-11-05 ENCOUNTER — Ambulatory Visit (INDEPENDENT_AMBULATORY_CARE_PROVIDER_SITE_OTHER): Payer: 59 | Admitting: Sports Medicine

## 2021-11-05 DIAGNOSIS — E6609 Other obesity due to excess calories: Secondary | ICD-10-CM

## 2021-11-05 DIAGNOSIS — M5416 Radiculopathy, lumbar region: Secondary | ICD-10-CM | POA: Diagnosis not present

## 2021-11-05 MED ORDER — PREDNISONE 50 MG PO TABS: ORAL_TABLET | ORAL | 0 refills | Status: DC

## 2021-11-05 MED ORDER — PHENTERMINE HCL 37.5 MG PO TABS: ORAL_TABLET | ORAL | 0 refills | Status: DC

## 2021-11-05 NOTE — Progress Notes (Signed)
° ° °  Procedures performed today:    None.  Independent interpretation of notes and tests performed by another provider:   None.  Brief History, Exam, Impression, and Recommendations:    Obesity Michael Rubio returns, he lost 8 pounds after the first month on phentermine, doing well, he is more productive, happier, sleeping better. Entering the second month.  Lumbar radiculopathy, chronic right S1 Has historically done well with conservative treatment, now having increasing right-sided axial low back pain worse with sitting, flexion, Valsalva, we will print out the herniated disc condition, add 5 days of prednisone, return to see me in a month for this, if insufficient improvement we will consider advanced imaging due to his history of renal cell carcinoma.    ___________________________________________ Michael Rubio, M.D., ABFM., CAQSM. Primary Care and [place] Instructor of [employer] [facility] of Medicine

## 2021-11-05 NOTE — Assessment & Plan Note (Signed)
Has historically done well with conservative treatment, now having increasing right-sided axial low back pain worse with sitting, flexion, Valsalva, we will print out the herniated disc condition, add 5 days of prednisone, return to see me in a month for this, if insufficient improvement we will consider advanced imaging due to his history of renal cell carcinoma.

## 2021-11-05 NOTE — Assessment & Plan Note (Signed)
Lejon returns, he lost 8 pounds after the first month on phentermine, doing well, he is more productive, happier, sleeping better. Entering the second month.

## 2021-12-07 ENCOUNTER — Encounter: Payer: Self-pay | Admitting: Sports Medicine

## 2021-12-07 ENCOUNTER — Other Ambulatory Visit: Payer: Self-pay

## 2021-12-07 ENCOUNTER — Ambulatory Visit (INDEPENDENT_AMBULATORY_CARE_PROVIDER_SITE_OTHER): Payer: 59 | Admitting: Sports Medicine

## 2021-12-07 DIAGNOSIS — E6609 Other obesity due to excess calories: Secondary | ICD-10-CM

## 2021-12-07 DIAGNOSIS — M5416 Radiculopathy, lumbar region: Secondary | ICD-10-CM | POA: Diagnosis not present

## 2021-12-07 MED ORDER — PHENTERMINE HCL 37.5 MG PO TABS: ORAL_TABLET | ORAL | 0 refills | Status: DC

## 2021-12-07 NOTE — Assessment & Plan Note (Addendum)
Chronic axial right-sided back pain worse with sitting, flexion, Valsalva, we added herniated disc conditioning, prednisone, he returns today feeling a lot better. ?He does have a history of renal cell carcinoma, tells me he got his 5-year clearance from his urologists. ?He did recently have a CT scan but I will unable to see if it included his lumbar spine, so he will call [facility] and have them fax over his CT results. ?If his lumbar spine was not included I think we should MRI or CT his L-spine. ?

## 2021-12-07 NOTE — Assessment & Plan Note (Signed)
Michael Rubio returns, he has had an additional 7 pound weight loss after the second month of phentermine, refilling medication, entering the third month. ?Mild ED mitigated with an extra dose of Cialis. ?

## 2021-12-07 NOTE — Progress Notes (Addendum)
? ? ?  Procedures performed today:   ? ?None. ? ?Independent interpretation of notes and tests performed by another provider:  ? ?None. ? ?Brief History, Exam, Impression, and Recommendations:   ? ?Obesity ?Michael Rubio returns, he has had an additional 7 pound weight loss after the second month of phentermine, refilling medication, entering the third month. ?Mild ED mitigated with an extra dose of Cialis. ? ?Lumbar radiculopathy, chronic right S1 ?Chronic axial right-sided back pain worse with sitting, flexion, Valsalva, we added herniated disc conditioning, prednisone, he returns today feeling a lot better. ?He does have a history of renal cell carcinoma, tells me he got his 5-year clearance from his urologists. ?He did recently have a CT scan but I will unable to see if it included his lumbar spine, so he will call [facility] and have them fax over his CT results. ?If his lumbar spine was not included I think we should MRI or CT his L-spine. ? ? ? ?___________________________________________ ?Gwen Her. Dianah Field, M.D., ABFM., CAQSM. ?Primary Care and Sports Medicine ?[place] ? ?Adjunct Instructor of Family Medicine  ?[employer] ?

## 2022-01-04 ENCOUNTER — Ambulatory Visit: Payer: 59 | Admitting: Sports Medicine

## 2022-01-04 ENCOUNTER — Ambulatory Visit (INDEPENDENT_AMBULATORY_CARE_PROVIDER_SITE_OTHER): Payer: 59 | Admitting: Sports Medicine

## 2022-01-04 DIAGNOSIS — N529 Male erectile dysfunction, unspecified: Secondary | ICD-10-CM | POA: Diagnosis not present

## 2022-01-04 DIAGNOSIS — M5416 Radiculopathy, lumbar region: Secondary | ICD-10-CM | POA: Diagnosis not present

## 2022-01-04 DIAGNOSIS — E6609 Other obesity due to excess calories: Secondary | ICD-10-CM

## 2022-01-04 MED ORDER — PHENTERMINE HCL 37.5 MG PO TABS: ORAL_TABLET | ORAL | 0 refills | Status: DC

## 2022-01-04 MED ORDER — TADALAFIL 10 MG PO TABS: 10.0000 mg | ORAL_TABLET | Freq: Every day | ORAL | 3 refills | Status: DC | PRN

## 2022-01-04 NOTE — Assessment & Plan Note (Signed)
See prior notes, resolved with conservative treatment. ?

## 2022-01-04 NOTE — Assessment & Plan Note (Signed)
We will go ahead and bump him up to 10 mg to use occasionally, phentermine does cause occasional worsening of erectile dysfunction. ?

## 2022-01-04 NOTE — Assessment & Plan Note (Signed)
Additional 9 pound weight loss after the third month, entering the fourth month. ?

## 2022-01-04 NOTE — Progress Notes (Signed)
? ? ?  Procedures performed today:   ? ?None. ? ?Independent interpretation of notes and tests performed by another provider:  ? ?None. ? ?Brief History, Exam, Impression, and Recommendations:   ? ?Obesity ?Additional 9 pound weight loss after the third month, entering the fourth month. ? ?Lumbar radiculopathy, chronic right S1 ?See prior notes, resolved with conservative treatment. ? ?Erectile dysfunction ?We will go ahead and bump him up to 10 mg to use occasionally, phentermine does cause occasional worsening of erectile dysfunction. ? ?Chronic process with exacerbation and pharmacologic intervention ? ?___________________________________________ ?Gwen Her. Dianah Field, M.D., ABFM., CAQSM. ?Primary Care and Sports Medicine ?[place] ? ?Adjunct Instructor of Family Medicine  ?[employer] ?

## 2022-01-11 ENCOUNTER — Ambulatory Visit: Payer: 59 | Admitting: Sports Medicine

## 2022-02-04 ENCOUNTER — Ambulatory Visit (INDEPENDENT_AMBULATORY_CARE_PROVIDER_SITE_OTHER): Payer: 59 | Admitting: Sports Medicine

## 2022-02-04 ENCOUNTER — Encounter: Payer: Self-pay | Admitting: Sports Medicine

## 2022-02-04 DIAGNOSIS — E6609 Other obesity due to excess calories: Secondary | ICD-10-CM | POA: Diagnosis not present

## 2022-02-04 MED ORDER — PHENTERMINE HCL 37.5 MG PO TABS: ORAL_TABLET | ORAL | 0 refills | Status: AC

## 2022-02-04 NOTE — Assessment & Plan Note (Signed)
Additional 3 pound weight loss, entering fifth month. ? ?

## 2022-02-04 NOTE — Progress Notes (Signed)
? ? ?  Procedures performed today:   ? ?None. ? ?Independent interpretation of notes and tests performed by another provider:  ? ?None. ? ?Brief History, Exam, Impression, and Recommendations:   ? ?Obesity ?Additional 3 pound weight loss, entering fifth month. ? ? ? ?___________________________________________ ?Gwen Her. Dianah Field, M.D., ABFM., CAQSM. ?Primary Care and Sports Medicine ?[place] ? ?Adjunct Instructor of Family Medicine  ?[employer] ?

## 2022-03-11 ENCOUNTER — Ambulatory Visit: Payer: 59 | Admitting: Sports Medicine

## 2022-09-09 ENCOUNTER — Other Ambulatory Visit: Payer: Self-pay | Admitting: Sports Medicine

## 2022-09-09 DIAGNOSIS — N529 Male erectile dysfunction, unspecified: Secondary | ICD-10-CM

## 2022-10-10 ENCOUNTER — Encounter: Payer: 59 | Admitting: Sports Medicine

## 2022-10-10 ENCOUNTER — Telehealth: Payer: Self-pay | Admitting: Sports Medicine

## 2022-10-10 DIAGNOSIS — R030 Elevated blood-pressure reading, without diagnosis of hypertension: Secondary | ICD-10-CM

## 2022-10-10 NOTE — Telephone Encounter (Signed)
Yes sir, labs ordered!

## 2022-10-10 NOTE — Telephone Encounter (Signed)
Pt's physical was rescheduled for 01/25 at 915. He would like his labs put in so that he can have them done before appt.

## 2022-10-17 ENCOUNTER — Ambulatory Visit (INDEPENDENT_AMBULATORY_CARE_PROVIDER_SITE_OTHER): Payer: 59 | Admitting: Sports Medicine

## 2022-10-17 ENCOUNTER — Encounter: Payer: Self-pay | Admitting: Sports Medicine

## 2022-10-17 VITALS — BP 131/86 | HR 85 | Wt 249.0 lb

## 2022-10-17 DIAGNOSIS — N529 Male erectile dysfunction, unspecified: Secondary | ICD-10-CM | POA: Diagnosis not present

## 2022-10-17 DIAGNOSIS — Z0000 Encounter for general adult medical examination without abnormal findings: Secondary | ICD-10-CM

## 2022-10-17 DIAGNOSIS — R030 Elevated blood-pressure reading, without diagnosis of hypertension: Secondary | ICD-10-CM | POA: Diagnosis not present

## 2022-10-17 DIAGNOSIS — F32A Depression, unspecified: Secondary | ICD-10-CM | POA: Insufficient documentation

## 2022-10-17 MED ORDER — TADALAFIL 5 MG PO TABS: 5.0000 mg | ORAL_TABLET | Freq: Every day | ORAL | 3 refills | Status: DC | PRN

## 2022-10-17 MED ORDER — SERTRALINE HCL 25 MG PO TABS: 25.0000 mg | ORAL_TABLET | Freq: Every day | ORAL | 2 refills | Status: DC

## 2022-10-17 NOTE — Assessment & Plan Note (Signed)
Annual physical as above, up-to-date on screenings, return to see me in a year for this. Getting routine labs today.

## 2022-10-17 NOTE — Addendum Note (Signed)
Addended by: Silverio Decamp on: 10/17/2022 10:52 AM   Modules accepted: Orders

## 2022-10-17 NOTE — Progress Notes (Addendum)
Subjective:    CC: Annual Physical Exam  HPI:  This patient is here for their annual physical  I reviewed the past medical history, family history, social history, surgical history, and allergies today and no changes were needed.  Please see the problem list section below in epic for further details.  Past Medical History: Past Medical History:  Diagnosis Date   Asthma    as a child   Chest pain, atypical 2012   possible angina per pt- stress test neg   Chronic kidney disease    renal mass   Complication of anesthesia    woke up in surgery x2- no recall   Lumbar radiculopathy, chronic right S1 06/24/2012   Past Surgical History: Past Surgical History:  Procedure Laterality Date   ROBOTIC ASSITED PARTIAL NEPHRECTOMY Left 04/10/2016   Procedure: XI ROBOTIC ASSITED LEFT  PARTIAL NEPHRECTOMY;  Surgeon: Ardis Hughs, MD;  Location: [facility];  Service: Urology;  Laterality: Left;  Clamp on: 1408 Clamp off: 1420 Total clamp time: 12 minutes   WISDOM TOOTH EXTRACTION     WRIST SURGERY     Social History: Social History   Socioeconomic History   Marital status: Married    Spouse name: Not on file   Number of children: Not on file   Years of education: Not on file   Highest education level: Not on file  Occupational History   Not on file  Tobacco Use   Smoking status: Former    Packs/day: 1.00    Years: 10.00    Total pack years: 10.00    Types: Cigarettes    Quit date: 10/06/2010    Years since quitting: 12.0   Smokeless tobacco: Current    Types: Snuff, Chew  Substance and Sexual Activity   Alcohol use: Yes    Alcohol/week: 10.0 standard drinks of alcohol    Types: 10 Cans of beer per week   Drug use: No   Sexual activity: Not on file  Other Topics Concern   Not on file  Social History Narrative   Not on file   Social Determinants of Health   Financial Resource Strain: Not on file  Food Insecurity: Not on file  Transportation Needs: Not on file   Physical Activity: Not on file  Stress: Not on file  Social Connections: Not on file   Family History: Family History  Problem Relation Age of Onset   Heart attack Mother    Heart disease Mother    Heart attack Father    Heart disease Father    Heart disease Maternal Grandmother    Diabetes Maternal Grandmother    Heart disease Maternal Grandfather    COPD Paternal Grandfather    Heart disease Paternal Grandfather    Allergies: Allergies  Allergen Reactions   Ciprofloxacin Other (See Comments)    BACK PAIN   Penicillins Rash   Medications: See med rec.  Review of Systems: No headache, visual changes, nausea, vomiting, diarrhea, constipation, dizziness, abdominal pain, skin rash, fevers, chills, night sweats, swollen lymph nodes, weight loss, chest pain, body aches, joint swelling, muscle aches, shortness of breath, mood changes, visual or auditory hallucinations.  Objective:    General: Well Developed, well nourished, and in no acute distress.  Neuro: Alert and oriented x3, extra-ocular muscles intact, sensation grossly intact. Cranial nerves II through XII are intact, motor, sensory, and coordinative functions are all intact. HEENT: Normocephalic, atraumatic, pupils equal round reactive to light, neck supple, no masses, no lymphadenopathy,  thyroid nonpalpable. Oropharynx, nasopharynx, external ear canals are unremarkable. Skin: Warm and dry, no rashes noted.  Cardiac: Regular rate and rhythm, no murmurs rubs or gallops.  Respiratory: Clear to auscultation bilaterally. Not using accessory muscles, speaking in full sentences.  Abdominal: Soft, nontender, nondistended, positive bowel sounds, no masses, no organomegaly.  Musculoskeletal: Shoulder, elbow, wrist, hip, knee, ankle stable, and with full range of motion.  Impression and Recommendations:    The patient was counselled, risk factors were discussed, anticipatory guidance given.  Annual physical exam Annual physical  as above, up-to-date on screenings, return to see me in a year for this. Getting routine labs today.  Anxiety and depression Caio has multiple stressors in his life.  2 businesses, and some increases in expenditures, some strife around the house. 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 He has a fantastic amount of insight into the condition, he is greatly interested in treatment. We will start sertraline 25 daily, return in 6 weeks for repeat PHQ and GAD.   Erectile dysfunction Did well with half tab, dropping him down to 5 mg daily.   ____________________________________________ Gwen Her. Dianah Field, M.D., ABFM., CAQSM., AME. Primary Care and Sports Medicine [facility]  Adjunct Professor of [place] [employer] [facility] of Medicine  Risk manager

## 2022-10-17 NOTE — Assessment & Plan Note (Signed)
Did well with half tab, dropping him down to 5 mg daily.

## 2022-10-17 NOTE — Assessment & Plan Note (Signed)
Michael Rubio has multiple stressors in his life.  2 businesses, and some increases in expenditures, some strife around the house. 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 He has a fantastic amount of insight into the condition, he is greatly interested in treatment. We will start sertraline 25 daily, return in 6 weeks for repeat PHQ and GAD.

## 2022-10-18 LAB — COMPLETE METABOLIC PANEL WITH GFR
AG Ratio: 1.6 (calc) (ref 1.0–2.5)
ALT: 48 U/L — ABNORMAL HIGH (ref 9–46)
AST: 25 U/L (ref 10–40)
Albumin: 4.6 g/dL (ref 3.6–5.1)
Alkaline phosphatase (APISO): 51 U/L (ref 36–130)
BUN: 15 mg/dL (ref 7–25)
CO2: 25 mmol/L (ref 20–32)
Calcium: 9.5 mg/dL (ref 8.6–10.3)
Chloride: 105 mmol/L (ref 98–110)
Creat: 0.99 mg/dL (ref 0.60–1.29)
Globulin: 2.9 g/dL (calc) (ref 1.9–3.7)
Glucose, Bld: 92 mg/dL (ref 65–99)
Potassium: 4.2 mmol/L (ref 3.5–5.3)
Sodium: 140 mmol/L (ref 135–146)
Total Bilirubin: 0.8 mg/dL (ref 0.2–1.2)
Total Protein: 7.5 g/dL (ref 6.1–8.1)
eGFR: 97 mL/min/{1.73_m2} (ref >=60)

## 2022-10-18 LAB — LIPID PANEL
Cholesterol: 208 mg/dL — ABNORMAL HIGH (ref <200)
HDL: 73 mg/dL (ref >=40)
LDL Cholesterol (Calc): 117 mg/dL (calc) — ABNORMAL HIGH
Non-HDL Cholesterol (Calc): 135 mg/dL (calc) — ABNORMAL HIGH (ref <130)
Total CHOL/HDL Ratio: 2.8 (calc) (ref <5.0)
Triglycerides: 84 mg/dL (ref <150)

## 2022-10-18 LAB — CBC
HCT: 47.2 % (ref 38.5–50.0)
Hemoglobin: 16.5 g/dL (ref 13.2–17.1)
MCH: 29.9 pg (ref 27.0–33.0)
MCHC: 35 g/dL (ref 32.0–36.0)
MCV: 85.7 fL (ref 80.0–100.0)
MPV: 10.1 fL (ref 7.5–12.5)
Platelets: 240 10*3/uL (ref 140–400)
RBC: 5.51 10*6/uL (ref 4.20–5.80)
RDW: 12.8 % (ref 11.0–15.0)
WBC: 6.3 10*3/uL (ref 3.8–10.8)

## 2022-10-18 LAB — HEMOGLOBIN A1C
Hgb A1c MFr Bld: 5.3 % of total Hgb (ref <5.7)
Mean Plasma Glucose: 105 mg/dL
eAG (mmol/L): 5.8 mmol/L

## 2022-10-18 LAB — TSH: TSH: 1 mIU/L (ref 0.40–4.50)

## 2022-10-28 ENCOUNTER — Encounter: Payer: Self-pay | Admitting: Sports Medicine

## 2022-11-08 ENCOUNTER — Other Ambulatory Visit: Payer: Self-pay | Admitting: Sports Medicine

## 2022-11-08 DIAGNOSIS — F419 Anxiety disorder, unspecified: Secondary | ICD-10-CM

## 2022-11-28 ENCOUNTER — Ambulatory Visit: Payer: 59 | Admitting: Sports Medicine

## 2022-12-05 ENCOUNTER — Ambulatory Visit: Payer: 59 | Admitting: Sports Medicine

## 2023-01-14 ENCOUNTER — Other Ambulatory Visit: Payer: Self-pay | Admitting: Sports Medicine

## 2023-01-14 DIAGNOSIS — F32A Depression, unspecified: Secondary | ICD-10-CM

## 2023-02-06 ENCOUNTER — Other Ambulatory Visit: Payer: Self-pay | Admitting: Sports Medicine

## 2023-02-06 DIAGNOSIS — F32A Depression, unspecified: Secondary | ICD-10-CM

## 2023-04-16 ENCOUNTER — Other Ambulatory Visit: Payer: Self-pay | Admitting: Sports Medicine

## 2023-04-16 DIAGNOSIS — F419 Anxiety disorder, unspecified: Secondary | ICD-10-CM

## 2023-07-30 ENCOUNTER — Ambulatory Visit (INDEPENDENT_AMBULATORY_CARE_PROVIDER_SITE_OTHER): Payer: 59 | Admitting: Sports Medicine

## 2023-07-30 ENCOUNTER — Encounter: Payer: Self-pay | Admitting: Sports Medicine

## 2023-07-30 ENCOUNTER — Ambulatory Visit (INDEPENDENT_AMBULATORY_CARE_PROVIDER_SITE_OTHER): Payer: 59

## 2023-07-30 VITALS — BP 132/88 | HR 89 | Ht 72.0 in | Wt 250.0 lb

## 2023-07-30 DIAGNOSIS — R1013 Epigastric pain: Secondary | ICD-10-CM

## 2023-07-30 DIAGNOSIS — R1010 Upper abdominal pain, unspecified: Secondary | ICD-10-CM

## 2023-07-30 DIAGNOSIS — I878 Other specified disorders of veins: Secondary | ICD-10-CM | POA: Diagnosis not present

## 2023-07-30 MED ORDER — PANTOPRAZOLE SODIUM 40 MG PO TBEC
40.0000 mg | DELAYED_RELEASE_TABLET | Freq: Two times a day (BID) | ORAL | 3 refills | Status: AC
Start: 2023-07-30 — End: ?

## 2023-07-30 MED ORDER — SUCRALFATE 1 G PO TABS
1.0000 g | ORAL_TABLET | Freq: Four times a day (QID) | ORAL | 0 refills | Status: AC
Start: 2023-07-30 — End: ?

## 2023-07-30 NOTE — Assessment & Plan Note (Signed)
This is a pleasant 42 year old male, he has had about a month of increasing midepigastric and left mid chest pain radiating straight to the back, worse with eating, significant eructations, some sour brash. No melena, hematochezia, hematemesis. Pain is not worsened with exertion, it is worsened with bending forward. He does get occasional sticking of food when he swallows. On exam he has no reproduction of pain with palpation of the chest wall, abdominal exam is normal. I do suspect more of a dyspepsia picture, hiatal hernia, PUD, gastritis, esophagitis. We will add Protonix 40 twice daily, pantoprazole, abdominal x-ray, have also given him some dietary recommendations. If not better in 4 to 6 weeks we will refer to gastroenterology.

## 2023-07-30 NOTE — Progress Notes (Signed)
    Procedures performed today:    None.  Independent interpretation of notes and tests performed by another provider:   None.  Brief History, Exam, Impression, and Recommendations:    Dyspepsia This is a pleasant 42 year old male, he has had about a month of increasing midepigastric and left mid chest pain radiating straight to the back, worse with eating, significant eructations, some sour brash. No melena, hematochezia, hematemesis. Pain is not worsened with exertion, it is worsened with bending forward. He does get occasional sticking of food when he swallows. On exam he has no reproduction of pain with palpation of the chest wall, abdominal exam is normal. I do suspect more of a dyspepsia picture, hiatal hernia, PUD, gastritis, esophagitis. We will add Protonix 40 twice daily, pantoprazole, abdominal x-ray, have also given him some dietary recommendations. If not better in 4 to 6 weeks we will refer to gastroenterology.    ____________________________________________ Ihor Austin. Benjamin Stain, M.D., ABFM., CAQSM., AME. Primary Care and Sports Medicine [facility]  Adjunct Professor of Family Medicine  [employer]  Restaurant manager, [employer]

## 2023-09-03 ENCOUNTER — Ambulatory Visit: Payer: 59 | Admitting: Sports Medicine

## 2023-11-20 ENCOUNTER — Other Ambulatory Visit: Payer: Self-pay | Admitting: Sports Medicine

## 2023-11-20 DIAGNOSIS — N529 Male erectile dysfunction, unspecified: Secondary | ICD-10-CM

## 2024-05-25 ENCOUNTER — Encounter: Payer: Self-pay | Admitting: Sports Medicine

## 2024-10-19 ENCOUNTER — Encounter: Admitting: Urgent Care
